# Patient Record
Sex: Female | Born: 1960
Health system: Southern US, Community
[De-identification: ages and names within clinical notes are randomized; demographics above are authoritative.]

## PROBLEM LIST (undated history)

## (undated) DIAGNOSIS — J309 Allergic rhinitis, unspecified: Secondary | ICD-10-CM

## (undated) DIAGNOSIS — I10 Essential (primary) hypertension: Secondary | ICD-10-CM

## (undated) DIAGNOSIS — Z8042 Family history of malignant neoplasm of prostate: Secondary | ICD-10-CM

## (undated) DIAGNOSIS — Z91012 Allergy to eggs: Secondary | ICD-10-CM

## (undated) DIAGNOSIS — Z801 Family history of malignant neoplasm of trachea, bronchus and lung: Secondary | ICD-10-CM

## (undated) DIAGNOSIS — G43909 Migraine, unspecified, not intractable, without status migrainosus: Secondary | ICD-10-CM

## (undated) DIAGNOSIS — M419 Scoliosis, unspecified: Secondary | ICD-10-CM

## (undated) HISTORY — DX: Migraine, unspecified, not intractable, without status migrainosus: G43.909

## (undated) HISTORY — DX: Allergy to eggs: Z91.012

## (undated) HISTORY — DX: Allergic rhinitis, unspecified: J30.9

## (undated) HISTORY — DX: Family history of malignant neoplasm of prostate: Z80.42

## (undated) HISTORY — PX: APPENDECTOMY: SHX54

## (undated) HISTORY — DX: Scoliosis, unspecified: M41.9

## (undated) HISTORY — DX: Essential (primary) hypertension: I10

## (undated) HISTORY — PX: OTHER SURGICAL HISTORY: SHX169

## (undated) HISTORY — DX: Family history of malignant neoplasm of trachea, bronchus and lung: Z80.1

---

## 2004-03-18 ENCOUNTER — Ambulatory Visit (HOSPITAL_BASED_OUTPATIENT_CLINIC_OR_DEPARTMENT_OTHER): Admission: RE | Admit: 2004-03-18 | Discharge: 2004-03-18 | Payer: Self-pay | Admitting: Orthopedic Surgery

## 2005-08-05 ENCOUNTER — Encounter: Admission: RE | Admit: 2005-08-05 | Discharge: 2005-08-05 | Payer: Self-pay | Admitting: Internal Medicine

## 2008-10-06 ENCOUNTER — Ambulatory Visit: Payer: Self-pay | Admitting: Internal Medicine

## 2009-02-25 ENCOUNTER — Emergency Department (HOSPITAL_COMMUNITY): Admission: EM | Admit: 2009-02-25 | Discharge: 2009-02-25 | Payer: Self-pay | Admitting: Family Medicine

## 2009-03-16 ENCOUNTER — Ambulatory Visit: Payer: Self-pay | Admitting: Internal Medicine

## 2009-06-19 ENCOUNTER — Ambulatory Visit: Payer: Self-pay | Admitting: Internal Medicine

## 2010-06-21 ENCOUNTER — Ambulatory Visit
Admission: RE | Admit: 2010-06-21 | Discharge: 2010-06-21 | Payer: Self-pay | Source: Home / Self Care | Attending: Internal Medicine | Admitting: Internal Medicine

## 2010-08-12 ENCOUNTER — Other Ambulatory Visit: Payer: Self-pay | Admitting: Internal Medicine

## 2010-08-13 ENCOUNTER — Encounter: Payer: Self-pay | Admitting: Internal Medicine

## 2010-08-13 DIAGNOSIS — Z0000 Encounter for general adult medical examination without abnormal findings: Secondary | ICD-10-CM

## 2010-09-13 ENCOUNTER — Other Ambulatory Visit: Payer: BC Managed Care – PPO | Admitting: Internal Medicine

## 2010-09-13 ENCOUNTER — Ambulatory Visit (INDEPENDENT_AMBULATORY_CARE_PROVIDER_SITE_OTHER): Payer: BC Managed Care – PPO | Admitting: Internal Medicine

## 2010-09-13 DIAGNOSIS — J309 Allergic rhinitis, unspecified: Secondary | ICD-10-CM

## 2010-09-16 ENCOUNTER — Ambulatory Visit (INDEPENDENT_AMBULATORY_CARE_PROVIDER_SITE_OTHER): Payer: BC Managed Care – PPO | Admitting: Internal Medicine

## 2010-09-16 DIAGNOSIS — J309 Allergic rhinitis, unspecified: Secondary | ICD-10-CM

## 2010-09-21 ENCOUNTER — Ambulatory Visit (INDEPENDENT_AMBULATORY_CARE_PROVIDER_SITE_OTHER): Payer: BC Managed Care – PPO | Admitting: Internal Medicine

## 2010-09-21 DIAGNOSIS — J301 Allergic rhinitis due to pollen: Secondary | ICD-10-CM

## 2010-09-24 ENCOUNTER — Ambulatory Visit (INDEPENDENT_AMBULATORY_CARE_PROVIDER_SITE_OTHER): Payer: BC Managed Care – PPO | Admitting: Internal Medicine

## 2010-09-24 DIAGNOSIS — J309 Allergic rhinitis, unspecified: Secondary | ICD-10-CM

## 2010-10-05 ENCOUNTER — Ambulatory Visit (INDEPENDENT_AMBULATORY_CARE_PROVIDER_SITE_OTHER): Payer: BC Managed Care – PPO

## 2010-10-05 ENCOUNTER — Ambulatory Visit: Payer: BC Managed Care – PPO | Admitting: Internal Medicine

## 2010-10-05 DIAGNOSIS — J309 Allergic rhinitis, unspecified: Secondary | ICD-10-CM

## 2010-10-08 ENCOUNTER — Ambulatory Visit (INDEPENDENT_AMBULATORY_CARE_PROVIDER_SITE_OTHER): Payer: BC Managed Care – PPO

## 2010-10-08 DIAGNOSIS — J309 Allergic rhinitis, unspecified: Secondary | ICD-10-CM

## 2010-10-08 NOTE — Progress Notes (Signed)
Patient here for allergy injections. Instructed patient to remain in office for 20 minutes after injections, and she stayed only 15 minutes.

## 2010-10-12 ENCOUNTER — Ambulatory Visit (INDEPENDENT_AMBULATORY_CARE_PROVIDER_SITE_OTHER): Payer: BC Managed Care – PPO | Admitting: *Deleted

## 2010-10-12 DIAGNOSIS — J309 Allergic rhinitis, unspecified: Secondary | ICD-10-CM

## 2010-10-15 NOTE — Op Note (Signed)
NAME:  TYERA, HANSLEY NO.:  [id number]   MEDICAL RECORD NO.:  [id number]          PATIENT TYPE:  AMB   LOCATION:  DSC                          FACILITY:  [facility]   PHYSICIAN:  Loreta Ave, M.D. 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  Status post previous arthroscopic  repair of Bankart cartilage and a thermocapsular shrinking. Failure with  recurrent dislocation following that.   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  Status post previous arthroscopic  repair of Bank cartilage and a thermocapsular shrinking. Failure with  recurrent dislocation following that.  Intact Bankart repair and capsular  stretch, relatively large Hill-Sachs lesion with chondral loose bodies.   PROCEDURE:  Right shoulder examination under anesthesia, arthroscopy,  removal of loose bodies, chondroplasty humerus, assessment of Bankart  repair.  Open anteroinferior capsular shift.   SURGEON:  Loreta Ave, M.D.   ASSISTANT:  Zonia Kief, P.A.-C.   ANESTHESIA:  General.   ESTIMATED BLOOD LOSS:  Minimal.   SPECIMENS:  None.   CULTURES:  None.   COMPLICATIONS:  None.   DRESSINGS:  Soft compressive with shoulder immobilizer.   TOURNIQUET TIME:  50 minutes.   DESCRIPTION OF PROCEDURE:  The patient was brought to the operating room and  after adequate anesthesia had been obtained,  right shoulder examined. Very  dramatic anterior instability with an anteroinferior component.  Somewhat of  a sulcus sign, a little posterior instability and global ligamentous  actually both sides, but the degree of anterior instability right shoulder  very dramatic.  Almost a posterior sulcus sign, even at rest. Placed in the  beach chair position, prepped and draped in the usual sterile fashion.   Anterior and posterior portals accessing the shoulder.  Shoulder inspected.  Bankart repair was still intact, a little bit of separation but really not a  significant recurrent lesion.  No further tearing labrum. A relatively  large, graphic 1.5 cm diameter Hill-Sachs lesion which would ride out the  front of the glenoid when she dislocated it about 90 degrees of external  rotation. Chondral loose bodies all removed. The margin of the Hill-Sachs  lesion debrided. Biceps tendon intact, cuff intact. After a thorough  inspection and debridement of the shoulder, instruments removed.   Anterior incision, coracoid to axillary fold, deltopectoral interval opened,  retractor put in place.  Conjoin tendon retracted.  Subscapularis tendon was  taken down, leaving some of the tendon on the capsule to reinforce the  capsular shift. Capsule was then cut from the 12 to 6 o'clock position on  the humerus, and at the 3 o'clock position, cut from the humerus to glenoid.  It was mobilized, freed up and although thickened from the thermal shrinkage  that it had in the past, this was still reasonable tissue quality.   I then did a shift, bringing the inferior leaflet up to 12 o'clock and  superior leaflet overlapping down to 6 o'clock.  Care was taken to avoid  injury to the axillary nerve at the bottom.  Thorough closure and  reinforcement of the capsular shift with numerous, nonabsorbable Ethibond  sutures. Subscapularis was then repaired anatomically.  This dramatically  improved the stability of the shoulder.  I could still bring her to a good  60 degrees of external rotation before I got tension feeling in the shift.  I felt like I had achieved what we needed to do to stabilize the shoulder.  The wound was irrigated, retractor removed.   Subcutaneous and subcuticular Vicryl closure over incision.  Nylon closure  of the posterior  portal.  Margins injected with Marcaine, as was the  shoulder.  Sterile, compressive dressing was applied, shoulder immobilizer  applied. Anesthesia was reversed, brought to the recovery room, tolerated  surgery well, no complications.      Ann Chambers   DFM/MEDQ  D:  03/18/2004  T:  03/18/2004  Job:  [id number]

## 2010-10-21 ENCOUNTER — Ambulatory Visit (INDEPENDENT_AMBULATORY_CARE_PROVIDER_SITE_OTHER): Payer: BC Managed Care – PPO | Admitting: *Deleted

## 2010-10-21 DIAGNOSIS — J309 Allergic rhinitis, unspecified: Secondary | ICD-10-CM

## 2010-10-21 NOTE — Progress Notes (Signed)
Pt here for allergy injections as ordered by [facility].  Administered per protocol.  Pt remained in office for 20 minutes with no reaction noted at injection site.  

## 2010-10-26 ENCOUNTER — Ambulatory Visit: Payer: BC Managed Care – PPO

## 2010-10-28 ENCOUNTER — Ambulatory Visit (INDEPENDENT_AMBULATORY_CARE_PROVIDER_SITE_OTHER): Payer: BC Managed Care – PPO | Admitting: Internal Medicine

## 2010-10-28 DIAGNOSIS — G43909 Migraine, unspecified, not intractable, without status migrainosus: Secondary | ICD-10-CM

## 2010-10-28 DIAGNOSIS — J309 Allergic rhinitis, unspecified: Secondary | ICD-10-CM

## 2010-10-28 NOTE — Progress Notes (Signed)
Patient here for allergy injections today per  Protocol from [facility]. Vial #1 T-G-W 1:100 dil 0.56ml in left arm. Dime sized redness after 20 minutes noted. Vial #2 D-M-DM 1:100 dil 0.3 ml [place] rt arm. No reaction after 20 minutes

## 2010-10-28 NOTE — Progress Notes (Signed)
  Subjective:    Patient ID: Ann Chambers, female    DOB: March 13, 1961, 50 y.o.   MRN: [id number]  HPI    Review of Systems     Objective:   Physical Exam        Assessment & Plan:

## 2010-11-02 ENCOUNTER — Ambulatory Visit (INDEPENDENT_AMBULATORY_CARE_PROVIDER_SITE_OTHER): Payer: BC Managed Care – PPO | Admitting: Internal Medicine

## 2010-11-02 ENCOUNTER — Encounter: Payer: Self-pay | Admitting: Internal Medicine

## 2010-11-02 DIAGNOSIS — J309 Allergic rhinitis, unspecified: Secondary | ICD-10-CM | POA: Insufficient documentation

## 2010-11-02 DIAGNOSIS — G43909 Migraine, unspecified, not intractable, without status migrainosus: Secondary | ICD-10-CM | POA: Insufficient documentation

## 2010-11-02 NOTE — Progress Notes (Signed)
  Subjective:    Patient ID: Ann Chambers, female    DOB: 01/06/1961, 50 y.o.   MRN: [id number]  HPI    Review of Systems     Objective:   Physical Exam        Assessment & Plan:   

## 2010-11-02 NOTE — Progress Notes (Signed)
  Subjective:    Patient ID: Ann Chambers, female    DOB: [birth year], 50 y.o.   MRN: [id number]  HPIFor allergy injections. Vaccine obtained from [facility]. It is easier for her to come here to get vaccines since she works nearby.Hx perennial and seasonal allergic rhinitis.    Review of Systems     Objective:   Physical ExamNot examined today jusy here for allergy injection.        Assessment & Plan:  Allergic rhiniits

## 2010-11-02 NOTE — Patient Instructions (Signed)
Return weekly for allergy injections

## 2010-11-04 ENCOUNTER — Ambulatory Visit: Payer: BC Managed Care – PPO

## 2010-11-04 ENCOUNTER — Ambulatory Visit: Payer: BC Managed Care – PPO | Admitting: Internal Medicine

## 2010-11-08 ENCOUNTER — Ambulatory Visit (INDEPENDENT_AMBULATORY_CARE_PROVIDER_SITE_OTHER): Payer: BC Managed Care – PPO | Admitting: Internal Medicine

## 2010-11-08 DIAGNOSIS — J309 Allergic rhinitis, unspecified: Secondary | ICD-10-CM

## 2010-11-08 NOTE — Progress Notes (Signed)
Patient here for allergy injections. Given per protocol from [facility].Tolerated well without evidence of reaction/ LReilandLpn

## 2010-11-11 ENCOUNTER — Ambulatory Visit (INDEPENDENT_AMBULATORY_CARE_PROVIDER_SITE_OTHER): Payer: BC Managed Care – PPO | Admitting: Internal Medicine

## 2010-11-11 DIAGNOSIS — J309 Allergic rhinitis, unspecified: Secondary | ICD-10-CM

## 2010-11-11 NOTE — Progress Notes (Signed)
Pt here for allergy injections as ordered by [facility].  Administered per protocol.  Pt remained in office for 20 minutes with no reaction noted at injection site.  

## 2010-11-15 ENCOUNTER — Ambulatory Visit (INDEPENDENT_AMBULATORY_CARE_PROVIDER_SITE_OTHER): Payer: BC Managed Care – PPO | Admitting: Internal Medicine

## 2010-11-15 DIAGNOSIS — J309 Allergic rhinitis, unspecified: Secondary | ICD-10-CM

## 2010-11-15 NOTE — Progress Notes (Signed)
After obtaining consent, and per orders of Dr. Lenord Fellers, injection of allergy given by Lonzo Candy. Patient instructed to remain in clinic for 20 minutes afterwards, and to report any adverse reaction to me immediately.

## 2010-11-22 ENCOUNTER — Ambulatory Visit (INDEPENDENT_AMBULATORY_CARE_PROVIDER_SITE_OTHER): Payer: BC Managed Care – PPO | Admitting: Internal Medicine

## 2010-11-22 DIAGNOSIS — J309 Allergic rhinitis, unspecified: Secondary | ICD-10-CM

## 2010-11-22 NOTE — Progress Notes (Signed)
Pt here for allergy injections as ordered by [facility].  Administered per protocol.  Pt remained in office for 20 minutes with no reaction noted at injection site.  

## 2010-12-02 ENCOUNTER — Ambulatory Visit (INDEPENDENT_AMBULATORY_CARE_PROVIDER_SITE_OTHER): Payer: BC Managed Care – PPO | Admitting: Internal Medicine

## 2010-12-02 DIAGNOSIS — J309 Allergic rhinitis, unspecified: Secondary | ICD-10-CM

## 2010-12-02 NOTE — Progress Notes (Signed)
Pt here for allergy injections as ordered by [facility].  Administered per protocol.  Pt remained in office for 20 minutes with no reaction noted at injection site.

## 2010-12-09 ENCOUNTER — Ambulatory Visit (INDEPENDENT_AMBULATORY_CARE_PROVIDER_SITE_OTHER): Payer: BC Managed Care – PPO | Admitting: Internal Medicine

## 2010-12-09 DIAGNOSIS — J309 Allergic rhinitis, unspecified: Secondary | ICD-10-CM

## 2010-12-09 NOTE — Progress Notes (Signed)
Pt here for allergy injections as ordered by [facility].  Administered per protocol.  Pt remained in office for 20 minutes with no reaction noted at injection site.  

## 2010-12-14 ENCOUNTER — Ambulatory Visit (INDEPENDENT_AMBULATORY_CARE_PROVIDER_SITE_OTHER): Payer: BC Managed Care – PPO | Admitting: Internal Medicine

## 2010-12-14 DIAGNOSIS — J309 Allergic rhinitis, unspecified: Secondary | ICD-10-CM

## 2010-12-14 NOTE — Progress Notes (Signed)
Pt here for allergy injections as ordered by [facility].  Administered per protocol.  Pt remained in office for 20 minutes with no reaction noted at injection site.  

## 2010-12-23 ENCOUNTER — Ambulatory Visit (INDEPENDENT_AMBULATORY_CARE_PROVIDER_SITE_OTHER): Payer: BC Managed Care – PPO | Admitting: Internal Medicine

## 2010-12-23 DIAGNOSIS — J309 Allergic rhinitis, unspecified: Secondary | ICD-10-CM

## 2010-12-23 DIAGNOSIS — J301 Allergic rhinitis due to pollen: Secondary | ICD-10-CM

## 2010-12-24 ENCOUNTER — Other Ambulatory Visit: Payer: Self-pay

## 2010-12-24 MED ORDER — ELETRIPTAN HYDROBROMIDE 40 MG PO TABS: 40.0000 mg | ORAL_TABLET | ORAL | Status: DC | PRN

## 2010-12-28 ENCOUNTER — Ambulatory Visit (INDEPENDENT_AMBULATORY_CARE_PROVIDER_SITE_OTHER): Payer: BC Managed Care – PPO | Admitting: Internal Medicine

## 2010-12-28 DIAGNOSIS — J309 Allergic rhinitis, unspecified: Secondary | ICD-10-CM

## 2010-12-28 NOTE — Progress Notes (Signed)
Pt here for allergy injections as ordered by [facility].  Administered per protocol.  Pt remained in office for 20 minutes with no reaction noted at injection site.  

## 2011-01-04 ENCOUNTER — Ambulatory Visit (INDEPENDENT_AMBULATORY_CARE_PROVIDER_SITE_OTHER): Payer: BC Managed Care – PPO | Admitting: Internal Medicine

## 2011-01-04 DIAGNOSIS — J309 Allergic rhinitis, unspecified: Secondary | ICD-10-CM

## 2011-01-04 NOTE — Progress Notes (Signed)
Pt here for allergy injections as ordered by [facility].  Administered per protocol.  Pt remained in office for 20 minutes with no reaction noted at injection site.  

## 2011-01-11 ENCOUNTER — Ambulatory Visit (INDEPENDENT_AMBULATORY_CARE_PROVIDER_SITE_OTHER): Payer: BC Managed Care – PPO | Admitting: Internal Medicine

## 2011-01-11 DIAGNOSIS — J309 Allergic rhinitis, unspecified: Secondary | ICD-10-CM

## 2011-01-11 NOTE — Progress Notes (Signed)
Pt here for allergy injections as ordered by [facility].  Administered per protocol.  Pt remained in office for 20 minutes with no reaction noted at injection site.  

## 2011-01-17 ENCOUNTER — Ambulatory Visit (INDEPENDENT_AMBULATORY_CARE_PROVIDER_SITE_OTHER): Payer: BC Managed Care – PPO | Admitting: Internal Medicine

## 2011-01-17 ENCOUNTER — Telehealth: Payer: Self-pay | Admitting: *Deleted

## 2011-01-17 DIAGNOSIS — J309 Allergic rhinitis, unspecified: Secondary | ICD-10-CM

## 2011-01-17 NOTE — Progress Notes (Signed)
Pt here for allergy injections as ordered by [facility].  Administered per protocol.  Pt remained in office for 20 minutes with no reaction noted at injection site.  

## 2011-01-17 NOTE — Telephone Encounter (Signed)
Spoke to Dr. Zelphia Cairo nurse regarding patient's allergy injections.  Instructed to continue with allergy injections once a week, stating that she can be at 5 ml for each vial.  She is scheduled for a follow-up appointment in March.

## 2011-01-25 ENCOUNTER — Ambulatory Visit (INDEPENDENT_AMBULATORY_CARE_PROVIDER_SITE_OTHER): Payer: BC Managed Care – PPO | Admitting: Internal Medicine

## 2011-01-25 DIAGNOSIS — J309 Allergic rhinitis, unspecified: Secondary | ICD-10-CM

## 2011-01-25 NOTE — Progress Notes (Signed)
Pt here for allergy injections as ordered by [facility].  Administered per protocol.  Pt remained in office for 20 minutes with no reaction noted at injection site.  

## 2011-01-28 ENCOUNTER — Ambulatory Visit: Payer: BC Managed Care – PPO | Admitting: Internal Medicine

## 2011-02-01 ENCOUNTER — Ambulatory Visit (INDEPENDENT_AMBULATORY_CARE_PROVIDER_SITE_OTHER): Payer: BC Managed Care – PPO | Admitting: Internal Medicine

## 2011-02-01 DIAGNOSIS — J309 Allergic rhinitis, unspecified: Secondary | ICD-10-CM

## 2011-02-01 NOTE — Progress Notes (Signed)
Pt here for allergy injections as ordered by [facility].  Administered per protocol.  Pt remained in office for 20 minutes with no reaction noted at injection site.

## 2011-02-07 ENCOUNTER — Ambulatory Visit: Payer: BC Managed Care – PPO | Admitting: Internal Medicine

## 2011-02-07 ENCOUNTER — Ambulatory Visit (INDEPENDENT_AMBULATORY_CARE_PROVIDER_SITE_OTHER): Payer: BC Managed Care – PPO | Admitting: Internal Medicine

## 2011-02-07 DIAGNOSIS — J309 Allergic rhinitis, unspecified: Secondary | ICD-10-CM

## 2011-02-07 NOTE — Progress Notes (Signed)
Pt here for allergy injections as ordered by [facility].  Administered per protocol.  Pt remained in office for 20 minutes with no reaction noted at injection site.  

## 2011-02-14 ENCOUNTER — Ambulatory Visit (INDEPENDENT_AMBULATORY_CARE_PROVIDER_SITE_OTHER): Payer: BC Managed Care – PPO | Admitting: Internal Medicine

## 2011-02-14 ENCOUNTER — Encounter: Payer: Self-pay | Admitting: Internal Medicine

## 2011-02-14 ENCOUNTER — Ambulatory Visit: Payer: BC Managed Care – PPO | Admitting: Internal Medicine

## 2011-02-14 VITALS — BP 110/62 | HR 68 | Temp 98.5°F | Ht 65.0 in | Wt 152.0 lb

## 2011-02-14 DIAGNOSIS — H6500 Acute serous otitis media, unspecified ear: Secondary | ICD-10-CM

## 2011-02-14 DIAGNOSIS — J069 Acute upper respiratory infection, unspecified: Secondary | ICD-10-CM

## 2011-02-14 DIAGNOSIS — J309 Allergic rhinitis, unspecified: Secondary | ICD-10-CM

## 2011-02-14 NOTE — Progress Notes (Signed)
  Subjective:    Patient ID: Ann Chambers, female    DOB: Jul 12, 1960, 50 y.o.   MRN: [id number]  HPI pleasant 50 year old white female with history of allergic rhinitis currently on twice weekly allergy injections as directed by Dr. Barnetta Chapel went to a wedding in [place] this past weekend. It ranged there the entire weekend. She has come down with respiratory infection. Has a lot of postnasal drip. No documented fever.    Review of Systems     Objective:   Physical Exam TMs are full bilaterally. Pharynx is clear. Neck is supple. Has anterior cervical nodes bilaterally. Chest is clear.        Assessment & Plan:  Serous otitis media  Upper respiratory infection  History of allergic rhinitis  Plan: Zithromax Z-Pak (6 tablets) 2 tablets by mouth day one followed by 1 tablet days 2 through 5 with 1 refill. If not better in one week have prescription refill. May take Allegra-D for congestion. May take Advil for headache or malaise. Hold off on allergy injections for one week.

## 2011-02-17 ENCOUNTER — Ambulatory Visit: Payer: BC Managed Care – PPO | Admitting: Internal Medicine

## 2011-03-04 ENCOUNTER — Ambulatory Visit (INDEPENDENT_AMBULATORY_CARE_PROVIDER_SITE_OTHER): Payer: BC Managed Care – PPO | Admitting: Internal Medicine

## 2011-03-04 DIAGNOSIS — J309 Allergic rhinitis, unspecified: Secondary | ICD-10-CM

## 2011-03-04 NOTE — Progress Notes (Signed)
  Subjective:    Patient ID: Ann Chambers, female    DOB: September 19, 1960, 50 y.o.   MRN: [id number]  HPI    Review of Systems     Objective:   Physical Exam        Assessment & Plan:

## 2011-03-07 ENCOUNTER — Ambulatory Visit (INDEPENDENT_AMBULATORY_CARE_PROVIDER_SITE_OTHER): Payer: BC Managed Care – PPO | Admitting: Internal Medicine

## 2011-03-07 DIAGNOSIS — J309 Allergic rhinitis, unspecified: Secondary | ICD-10-CM

## 2011-03-07 NOTE — Progress Notes (Signed)
Pt here for allergy injections as ordered by [facility].  Administered per protocol.  Pt remained in office for 20 minutes with no reaction noted at injection site.  

## 2011-03-15 ENCOUNTER — Ambulatory Visit (INDEPENDENT_AMBULATORY_CARE_PROVIDER_SITE_OTHER): Payer: BC Managed Care – PPO | Admitting: Internal Medicine

## 2011-03-15 DIAGNOSIS — J309 Allergic rhinitis, unspecified: Secondary | ICD-10-CM

## 2011-03-15 NOTE — Progress Notes (Signed)
Pt here for allergy injections as ordered by [facility].  Administered per protocol.  Pt remained in office for 20 minutes with no reaction noted at injection site.  

## 2011-03-21 ENCOUNTER — Ambulatory Visit (INDEPENDENT_AMBULATORY_CARE_PROVIDER_SITE_OTHER): Payer: BC Managed Care – PPO | Admitting: Internal Medicine

## 2011-03-21 DIAGNOSIS — J309 Allergic rhinitis, unspecified: Secondary | ICD-10-CM

## 2011-03-28 ENCOUNTER — Ambulatory Visit (INDEPENDENT_AMBULATORY_CARE_PROVIDER_SITE_OTHER): Payer: BC Managed Care – PPO | Admitting: Internal Medicine

## 2011-03-28 DIAGNOSIS — J309 Allergic rhinitis, unspecified: Secondary | ICD-10-CM

## 2011-03-28 DIAGNOSIS — J301 Allergic rhinitis due to pollen: Secondary | ICD-10-CM

## 2011-03-29 ENCOUNTER — Ambulatory Visit: Payer: BC Managed Care – PPO | Admitting: Internal Medicine

## 2011-04-07 ENCOUNTER — Ambulatory Visit: Payer: BC Managed Care – PPO | Admitting: Internal Medicine

## 2011-04-08 ENCOUNTER — Ambulatory Visit (INDEPENDENT_AMBULATORY_CARE_PROVIDER_SITE_OTHER): Payer: BC Managed Care – PPO | Admitting: Internal Medicine

## 2011-04-08 DIAGNOSIS — J301 Allergic rhinitis due to pollen: Secondary | ICD-10-CM

## 2011-04-08 DIAGNOSIS — J309 Allergic rhinitis, unspecified: Secondary | ICD-10-CM

## 2011-04-14 ENCOUNTER — Ambulatory Visit (INDEPENDENT_AMBULATORY_CARE_PROVIDER_SITE_OTHER): Payer: BC Managed Care – PPO | Admitting: Internal Medicine

## 2011-04-14 DIAGNOSIS — J309 Allergic rhinitis, unspecified: Secondary | ICD-10-CM

## 2011-04-14 NOTE — Progress Notes (Signed)
After obtaining consent, and per orders of Dr. Lenord Fellers, allergy injections given by Lonzo Candy. Patient instructed to remain in clinic for 20 minutes afterwards, and to report any adverse reaction to me immediately. No reaction noted.

## 2011-04-18 ENCOUNTER — Ambulatory Visit (INDEPENDENT_AMBULATORY_CARE_PROVIDER_SITE_OTHER): Payer: BC Managed Care – PPO | Admitting: Internal Medicine

## 2011-04-18 DIAGNOSIS — J309 Allergic rhinitis, unspecified: Secondary | ICD-10-CM

## 2011-04-29 ENCOUNTER — Ambulatory Visit (INDEPENDENT_AMBULATORY_CARE_PROVIDER_SITE_OTHER): Payer: BC Managed Care – PPO | Admitting: Internal Medicine

## 2011-04-29 DIAGNOSIS — J309 Allergic rhinitis, unspecified: Secondary | ICD-10-CM

## 2011-05-05 ENCOUNTER — Ambulatory Visit (INDEPENDENT_AMBULATORY_CARE_PROVIDER_SITE_OTHER): Payer: BC Managed Care – PPO | Admitting: Internal Medicine

## 2011-05-05 DIAGNOSIS — J309 Allergic rhinitis, unspecified: Secondary | ICD-10-CM

## 2011-05-10 ENCOUNTER — Ambulatory Visit (INDEPENDENT_AMBULATORY_CARE_PROVIDER_SITE_OTHER): Payer: BC Managed Care – PPO | Admitting: Internal Medicine

## 2011-05-10 DIAGNOSIS — J309 Allergic rhinitis, unspecified: Secondary | ICD-10-CM

## 2011-05-19 ENCOUNTER — Ambulatory Visit (INDEPENDENT_AMBULATORY_CARE_PROVIDER_SITE_OTHER): Payer: BC Managed Care – PPO | Admitting: Internal Medicine

## 2011-05-19 DIAGNOSIS — J309 Allergic rhinitis, unspecified: Secondary | ICD-10-CM

## 2011-05-27 ENCOUNTER — Ambulatory Visit: Payer: BC Managed Care – PPO | Admitting: Internal Medicine

## 2011-05-28 NOTE — Patient Instructions (Signed)
Call if reaction to allergy injection develops. Otherwise return for routine allergy injection at scheduled time

## 2011-05-28 NOTE — Progress Notes (Signed)
  Subjective:    Patient ID: Ann Chambers, female    DOB: 1961/02/06, 50 y.o.   MRN: [id number]  HPI in today for nurse visit for routine allergy injection. No complaints or problems.    Review of Systems     Objective:   Physical Exam not seen by a physician.        Assessment & Plan:  Allergic rhinitis  Plan allergy injection given her plan

## 2011-06-02 ENCOUNTER — Ambulatory Visit (INDEPENDENT_AMBULATORY_CARE_PROVIDER_SITE_OTHER): Payer: BC Managed Care – PPO | Admitting: Internal Medicine

## 2011-06-02 DIAGNOSIS — J309 Allergic rhinitis, unspecified: Secondary | ICD-10-CM

## 2011-06-09 ENCOUNTER — Ambulatory Visit (INDEPENDENT_AMBULATORY_CARE_PROVIDER_SITE_OTHER): Payer: BC Managed Care – PPO | Admitting: Internal Medicine

## 2011-06-09 DIAGNOSIS — J301 Allergic rhinitis due to pollen: Secondary | ICD-10-CM

## 2011-06-09 DIAGNOSIS — J309 Allergic rhinitis, unspecified: Secondary | ICD-10-CM

## 2011-06-16 ENCOUNTER — Ambulatory Visit: Payer: BC Managed Care – PPO | Admitting: Internal Medicine

## 2011-06-20 ENCOUNTER — Ambulatory Visit (INDEPENDENT_AMBULATORY_CARE_PROVIDER_SITE_OTHER): Payer: BC Managed Care – PPO | Admitting: Internal Medicine

## 2011-06-20 ENCOUNTER — Encounter: Payer: Self-pay | Admitting: Internal Medicine

## 2011-06-20 DIAGNOSIS — J309 Allergic rhinitis, unspecified: Secondary | ICD-10-CM

## 2011-06-20 NOTE — Patient Instructions (Signed)
Continue allergy injections on regular basis twice weekly

## 2011-06-20 NOTE — Progress Notes (Signed)
  Subjective:    Patient ID: Ann Chambers, female    DOB: 03-Jun-1960, 51 y.o.   MRN: [id number]  HPI for allergy injection today. Has not been having as many bouts of sinusitis this winter says she's been taking allergy injections on a regular basis. No significant reactions to allergy injections. Tries to come twice weekly to get these.    Review of Systems     Objective:   Physical Exam not examined        Assessment & Plan:  Allergic rhinitis

## 2011-06-26 ENCOUNTER — Encounter: Payer: Self-pay | Admitting: Internal Medicine

## 2011-06-28 ENCOUNTER — Ambulatory Visit (INDEPENDENT_AMBULATORY_CARE_PROVIDER_SITE_OTHER): Payer: BC Managed Care – PPO | Admitting: Internal Medicine

## 2011-06-28 DIAGNOSIS — J309 Allergic rhinitis, unspecified: Secondary | ICD-10-CM

## 2011-07-05 ENCOUNTER — Ambulatory Visit (INDEPENDENT_AMBULATORY_CARE_PROVIDER_SITE_OTHER): Payer: BC Managed Care – PPO | Admitting: Internal Medicine

## 2011-07-05 DIAGNOSIS — J309 Allergic rhinitis, unspecified: Secondary | ICD-10-CM

## 2011-07-05 DIAGNOSIS — J3089 Other allergic rhinitis: Secondary | ICD-10-CM

## 2011-07-14 ENCOUNTER — Ambulatory Visit (INDEPENDENT_AMBULATORY_CARE_PROVIDER_SITE_OTHER): Payer: BC Managed Care – PPO | Admitting: Internal Medicine

## 2011-07-14 DIAGNOSIS — J309 Allergic rhinitis, unspecified: Secondary | ICD-10-CM

## 2011-07-14 NOTE — Patient Instructions (Signed)
Return next week for allergy injection

## 2011-07-21 ENCOUNTER — Ambulatory Visit (INDEPENDENT_AMBULATORY_CARE_PROVIDER_SITE_OTHER): Payer: BC Managed Care – PPO | Admitting: Internal Medicine

## 2011-07-21 DIAGNOSIS — J309 Allergic rhinitis, unspecified: Secondary | ICD-10-CM

## 2011-07-25 ENCOUNTER — Ambulatory Visit (INDEPENDENT_AMBULATORY_CARE_PROVIDER_SITE_OTHER): Payer: BC Managed Care – PPO | Admitting: Internal Medicine

## 2011-07-25 DIAGNOSIS — J309 Allergic rhinitis, unspecified: Secondary | ICD-10-CM

## 2011-08-02 ENCOUNTER — Ambulatory Visit (INDEPENDENT_AMBULATORY_CARE_PROVIDER_SITE_OTHER): Payer: BC Managed Care – PPO | Admitting: Internal Medicine

## 2011-08-02 DIAGNOSIS — J309 Allergic rhinitis, unspecified: Secondary | ICD-10-CM

## 2011-08-08 ENCOUNTER — Ambulatory Visit (INDEPENDENT_AMBULATORY_CARE_PROVIDER_SITE_OTHER): Payer: BC Managed Care – PPO | Admitting: Internal Medicine

## 2011-08-08 DIAGNOSIS — J309 Allergic rhinitis, unspecified: Secondary | ICD-10-CM

## 2011-08-08 DIAGNOSIS — J3089 Other allergic rhinitis: Secondary | ICD-10-CM

## 2011-08-16 ENCOUNTER — Encounter: Payer: Self-pay | Admitting: Internal Medicine

## 2011-08-16 ENCOUNTER — Ambulatory Visit (INDEPENDENT_AMBULATORY_CARE_PROVIDER_SITE_OTHER): Payer: BC Managed Care – PPO | Admitting: Internal Medicine

## 2011-08-16 DIAGNOSIS — J309 Allergic rhinitis, unspecified: Secondary | ICD-10-CM

## 2011-08-25 ENCOUNTER — Ambulatory Visit (INDEPENDENT_AMBULATORY_CARE_PROVIDER_SITE_OTHER): Payer: BC Managed Care – PPO | Admitting: Internal Medicine

## 2011-08-25 DIAGNOSIS — J309 Allergic rhinitis, unspecified: Secondary | ICD-10-CM

## 2011-09-01 ENCOUNTER — Ambulatory Visit (INDEPENDENT_AMBULATORY_CARE_PROVIDER_SITE_OTHER): Payer: BC Managed Care – PPO | Admitting: Internal Medicine

## 2011-09-01 DIAGNOSIS — J309 Allergic rhinitis, unspecified: Secondary | ICD-10-CM

## 2011-09-05 ENCOUNTER — Telehealth: Payer: Self-pay | Admitting: Internal Medicine

## 2011-09-05 MED ORDER — AZITHROMYCIN 250 MG PO TABS: ORAL_TABLET | ORAL | Status: AC

## 2011-09-05 NOTE — Telephone Encounter (Signed)
Per Dr. Lenord Fellers, OK to call in a Zpak, 1 as directed to [address]

## 2011-09-16 ENCOUNTER — Ambulatory Visit: Payer: BC Managed Care – PPO | Admitting: Internal Medicine

## 2011-09-22 ENCOUNTER — Encounter: Payer: Self-pay | Admitting: Internal Medicine

## 2011-09-22 ENCOUNTER — Ambulatory Visit (INDEPENDENT_AMBULATORY_CARE_PROVIDER_SITE_OTHER): Payer: BC Managed Care – PPO | Admitting: Internal Medicine

## 2011-09-22 DIAGNOSIS — J309 Allergic rhinitis, unspecified: Secondary | ICD-10-CM

## 2011-09-22 NOTE — Progress Notes (Signed)
  Subjective:    Patient ID: Ann Chambers, female    DOB: Oct 25, 1960, 51 y.o.   MRN: [id number]  HPI for allergy injection given by nurse. Not seen by a physician.    Review of Systems     Objective:   Physical Exam not examined        Assessment & Plan:  Allergic rhinitis  Plan: Return next week for allergy injection.

## 2011-09-22 NOTE — Patient Instructions (Signed)
Return next week for allergy injection

## 2011-09-26 ENCOUNTER — Encounter: Payer: Self-pay | Admitting: Internal Medicine

## 2011-09-29 ENCOUNTER — Ambulatory Visit (INDEPENDENT_AMBULATORY_CARE_PROVIDER_SITE_OTHER): Payer: BC Managed Care – PPO | Admitting: Internal Medicine

## 2011-09-29 DIAGNOSIS — J309 Allergic rhinitis, unspecified: Secondary | ICD-10-CM

## 2011-10-06 ENCOUNTER — Ambulatory Visit (INDEPENDENT_AMBULATORY_CARE_PROVIDER_SITE_OTHER): Payer: BC Managed Care – PPO | Admitting: Internal Medicine

## 2011-10-06 DIAGNOSIS — J309 Allergic rhinitis, unspecified: Secondary | ICD-10-CM

## 2011-10-11 ENCOUNTER — Ambulatory Visit (INDEPENDENT_AMBULATORY_CARE_PROVIDER_SITE_OTHER): Payer: BC Managed Care – PPO | Admitting: Internal Medicine

## 2011-10-11 DIAGNOSIS — J309 Allergic rhinitis, unspecified: Secondary | ICD-10-CM

## 2011-10-18 ENCOUNTER — Ambulatory Visit (INDEPENDENT_AMBULATORY_CARE_PROVIDER_SITE_OTHER): Payer: BC Managed Care – PPO | Admitting: Internal Medicine

## 2011-10-18 DIAGNOSIS — J309 Allergic rhinitis, unspecified: Secondary | ICD-10-CM

## 2011-10-20 ENCOUNTER — Ambulatory Visit: Payer: BC Managed Care – PPO | Admitting: Internal Medicine

## 2011-10-25 ENCOUNTER — Ambulatory Visit (INDEPENDENT_AMBULATORY_CARE_PROVIDER_SITE_OTHER): Payer: BC Managed Care – PPO | Admitting: Internal Medicine

## 2011-10-25 ENCOUNTER — Ambulatory Visit: Payer: BC Managed Care – PPO | Admitting: Internal Medicine

## 2011-10-25 DIAGNOSIS — J309 Allergic rhinitis, unspecified: Secondary | ICD-10-CM

## 2011-10-31 ENCOUNTER — Ambulatory Visit (INDEPENDENT_AMBULATORY_CARE_PROVIDER_SITE_OTHER): Payer: BC Managed Care – PPO | Admitting: Internal Medicine

## 2011-10-31 DIAGNOSIS — J309 Allergic rhinitis, unspecified: Secondary | ICD-10-CM

## 2011-11-14 ENCOUNTER — Ambulatory Visit (INDEPENDENT_AMBULATORY_CARE_PROVIDER_SITE_OTHER): Payer: BC Managed Care – PPO | Admitting: Internal Medicine

## 2011-11-14 DIAGNOSIS — J309 Allergic rhinitis, unspecified: Secondary | ICD-10-CM

## 2011-11-29 ENCOUNTER — Ambulatory Visit (INDEPENDENT_AMBULATORY_CARE_PROVIDER_SITE_OTHER): Payer: BC Managed Care – PPO | Admitting: Internal Medicine

## 2011-11-29 DIAGNOSIS — J309 Allergic rhinitis, unspecified: Secondary | ICD-10-CM

## 2011-12-12 ENCOUNTER — Ambulatory Visit (INDEPENDENT_AMBULATORY_CARE_PROVIDER_SITE_OTHER): Payer: BC Managed Care – PPO | Admitting: Internal Medicine

## 2011-12-12 DIAGNOSIS — J309 Allergic rhinitis, unspecified: Secondary | ICD-10-CM

## 2011-12-13 ENCOUNTER — Telehealth: Payer: Self-pay | Admitting: Internal Medicine

## 2011-12-13 ENCOUNTER — Ambulatory Visit (INDEPENDENT_AMBULATORY_CARE_PROVIDER_SITE_OTHER): Payer: BC Managed Care – PPO | Admitting: Internal Medicine

## 2011-12-13 ENCOUNTER — Encounter: Payer: Self-pay | Admitting: Internal Medicine

## 2011-12-13 VITALS — BP 140/100 | HR 84 | Temp 98.5°F | Ht 65.0 in | Wt 152.0 lb

## 2011-12-13 DIAGNOSIS — L259 Unspecified contact dermatitis, unspecified cause: Secondary | ICD-10-CM

## 2011-12-13 NOTE — Telephone Encounter (Signed)
Sp w/pt; adv MJB would like for her to come in for OV.  She'll be here today @ 4pm to see if oral Prednisone is needed, etc.  Pt understands.

## 2011-12-13 NOTE — Telephone Encounter (Signed)
Needs OV today to get oral prednisone.

## 2011-12-26 NOTE — Progress Notes (Signed)
  Subjective:    Patient ID: Ann Chambers, female    DOB: [birth year], 51 y.o.   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 She's going out of town later in the week for a wedding. Has rash on right side of face and thinks it might be poison ivy. Just appeared this morning.    Review of Systems     Objective:   Physical Exam papular erythematous lesions right side of face consistent with contact dermatitis. No vesicles or drainage.        Assessment & Plan:  Contact dermatitis  Plan: Sterapred DS 10 mg 6 day dosepak take as directed. I note that her blood pressure is elevated today and we will continue to monitor blood pressures when she returns for routine immunotherapy injections.

## 2011-12-26 NOTE — Patient Instructions (Addendum)
Take Sterapred DS 10 mg 6 day dosepak as directed. 

## 2011-12-27 ENCOUNTER — Ambulatory Visit (INDEPENDENT_AMBULATORY_CARE_PROVIDER_SITE_OTHER): Payer: BC Managed Care – PPO | Admitting: Internal Medicine

## 2011-12-27 ENCOUNTER — Encounter: Payer: Self-pay | Admitting: Internal Medicine

## 2011-12-27 DIAGNOSIS — J309 Allergic rhinitis, unspecified: Secondary | ICD-10-CM

## 2012-01-10 ENCOUNTER — Encounter: Payer: Self-pay | Admitting: Internal Medicine

## 2012-01-10 ENCOUNTER — Ambulatory Visit (INDEPENDENT_AMBULATORY_CARE_PROVIDER_SITE_OTHER): Payer: BC Managed Care – PPO | Admitting: Internal Medicine

## 2012-01-10 DIAGNOSIS — J309 Allergic rhinitis, unspecified: Secondary | ICD-10-CM

## 2012-01-10 DIAGNOSIS — I10 Essential (primary) hypertension: Secondary | ICD-10-CM

## 2012-01-10 NOTE — Patient Instructions (Addendum)
Start Diovan 80 mg daily in the mornings and return in 2 weeks

## 2012-01-10 NOTE — Progress Notes (Signed)
  Subjective:    Patient ID: Ann Chambers, female    DOB: Dec 27, 1960, 51 y.o.   MRN: [id number]  HPI patient says sister has a condition called Essential Thrombocytosis and is on aspirin therapy. Sister also has hypertension. Patient's father had hypertension. Patient's blood pressure has been elevated recently. We've been following it for the past several weeks. Seems to be elevated during the day. She's been watching at home and at work. More likely to be normal at home than at work. Sister is on Diovan. Patient is off oral contraceptives now for the past 2 months. Despite that blood pressure is elevated. Review of Systems     Objective:   Physical Exam Chest clear to auscultation; cardiac exam regular rate and rhythm; extremities without edema, alert and oriented x3, skin is pale warm and dry        Assessment & Plan:  Hypertension  Allergic rhinitis  Plan: Start Diovan 80 mg daily and recheck blood pressure with office visit for allergy injection in 2 weeks. She has a physical exam scheduled in the near future and will be having fasting lab studies so we can check her platelet count. Her platelet count in 2011 was 270,000.

## 2012-01-12 ENCOUNTER — Other Ambulatory Visit: Payer: Self-pay

## 2012-01-12 MED ORDER — ELETRIPTAN HYDROBROMIDE 40 MG PO TABS: 40.0000 mg | ORAL_TABLET | ORAL | Status: DC | PRN

## 2012-01-24 ENCOUNTER — Ambulatory Visit (INDEPENDENT_AMBULATORY_CARE_PROVIDER_SITE_OTHER): Payer: BC Managed Care – PPO | Admitting: Internal Medicine

## 2012-01-24 DIAGNOSIS — I10 Essential (primary) hypertension: Secondary | ICD-10-CM

## 2012-01-24 DIAGNOSIS — J309 Allergic rhinitis, unspecified: Secondary | ICD-10-CM

## 2012-01-26 ENCOUNTER — Ambulatory Visit: Payer: BC Managed Care – PPO | Admitting: Internal Medicine

## 2012-01-27 ENCOUNTER — Encounter: Payer: Self-pay | Admitting: Internal Medicine

## 2012-01-27 NOTE — Progress Notes (Signed)
  Subjective:    Patient ID: Ann Chambers, female    DOB: 1961/03/21, 51 y.o.   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 This was selected because her sister is also on same medication. It has worked well for patient thus far without side effects. Blood pressure is markedly improved. Allergic rhinitis symptoms are improved on allergy injections. She recently obtained a new bottle of serum. Has tolerated the injections well without side effects or local reactions.    Review of Systems     Objective:   Physical Exam chest clear to auscultation, cardiac exam regular rate and rhythm normal S1 and S2, extremities without edema. No local reaction after allergy injection        Assessment & Plan:  Hypertension Allergic rhinitis  Plan: Continue allergy injections as previously scheduled. Blood pressure will be monitored at each allergy injection visit. Continue Diovan 80 mg daily. Time spent with patient 15 minutes.

## 2012-01-27 NOTE — Patient Instructions (Addendum)
Continue Diovan 80 mg daily for hypertension. We will continue giving allergy injections routinely. Blood pressure will be checked at time of allergy injections.

## 2012-02-07 ENCOUNTER — Ambulatory Visit (INDEPENDENT_AMBULATORY_CARE_PROVIDER_SITE_OTHER): Payer: BC Managed Care – PPO | Admitting: Internal Medicine

## 2012-02-07 DIAGNOSIS — J309 Allergic rhinitis, unspecified: Secondary | ICD-10-CM

## 2012-02-20 ENCOUNTER — Other Ambulatory Visit (INDEPENDENT_AMBULATORY_CARE_PROVIDER_SITE_OTHER): Payer: BC Managed Care – PPO | Admitting: Internal Medicine

## 2012-02-20 DIAGNOSIS — I10 Essential (primary) hypertension: Secondary | ICD-10-CM

## 2012-02-20 DIAGNOSIS — G43909 Migraine, unspecified, not intractable, without status migrainosus: Secondary | ICD-10-CM

## 2012-02-20 DIAGNOSIS — J309 Allergic rhinitis, unspecified: Secondary | ICD-10-CM

## 2012-02-21 ENCOUNTER — Encounter: Payer: Self-pay | Admitting: Internal Medicine

## 2012-02-21 ENCOUNTER — Ambulatory Visit (INDEPENDENT_AMBULATORY_CARE_PROVIDER_SITE_OTHER): Payer: BC Managed Care – PPO | Admitting: Internal Medicine

## 2012-02-21 VITALS — BP 114/66 | HR 84 | Temp 98.4°F | Ht 64.5 in | Wt 150.5 lb

## 2012-02-21 DIAGNOSIS — J309 Allergic rhinitis, unspecified: Secondary | ICD-10-CM

## 2012-02-21 DIAGNOSIS — I10 Essential (primary) hypertension: Secondary | ICD-10-CM

## 2012-02-21 LAB — CBC WITH DIFFERENTIAL/PLATELET
Basophils Absolute: 0 10*3/uL (ref 0.0–0.1)
Hemoglobin: 13 g/dL (ref 12.0–15.0)
MCH: 30.6 pg (ref 26.0–34.0)
MCV: 89.6 fL (ref 78.0–100.0)
Neutro Abs: 4.1 10*3/uL (ref 1.7–7.7)
Neutrophils Relative %: 65 % (ref 43–77)
Platelets: 288 10*3/uL (ref 150–400)
RBC: 4.25 MIL/uL (ref 3.87–5.11)
RDW: 13 % (ref 11.5–15.5)
WBC: 6.2 10*3/uL (ref 4.0–10.5)

## 2012-02-21 LAB — POCT URINALYSIS DIPSTICK
Bilirubin, UA: NEGATIVE
Blood, UA: NEGATIVE
Glucose, UA: NEGATIVE
Ketones, UA: NEGATIVE
Spec Grav, UA: 1.015
pH, UA: 6

## 2012-02-21 LAB — COMPREHENSIVE METABOLIC PANEL
AST: 26 U/L (ref 0–37)
BUN: 15 mg/dL (ref 6–23)
CO2: 26 mEq/L (ref 19–32)
Glucose, Bld: 85 mg/dL (ref 70–99)
Sodium: 139 mEq/L (ref 135–145)

## 2012-02-21 LAB — VITAMIN D 25 HYDROXY (VIT D DEFICIENCY, FRACTURES): Vit D, 25-Hydroxy: 41 ng/mL (ref 30–89)

## 2012-02-21 LAB — LIPID PANEL
LDL Cholesterol: 65 mg/dL (ref 0–99)
Total CHOL/HDL Ratio: 1.8 Ratio
VLDL: 10 mg/dL (ref 0–40)

## 2012-02-22 MED ORDER — VALSARTAN 80 MG PO TABS: 80.0000 mg | ORAL_TABLET | Freq: Every day | ORAL | Status: DC

## 2012-03-05 ENCOUNTER — Ambulatory Visit: Payer: BC Managed Care – PPO | Admitting: Internal Medicine

## 2012-03-08 ENCOUNTER — Ambulatory Visit (INDEPENDENT_AMBULATORY_CARE_PROVIDER_SITE_OTHER): Payer: BC Managed Care – PPO | Admitting: Internal Medicine

## 2012-03-08 DIAGNOSIS — J309 Allergic rhinitis, unspecified: Secondary | ICD-10-CM

## 2012-03-13 ENCOUNTER — Ambulatory Visit (INDEPENDENT_AMBULATORY_CARE_PROVIDER_SITE_OTHER): Payer: BC Managed Care – PPO | Admitting: Internal Medicine

## 2012-03-13 DIAGNOSIS — Z23 Encounter for immunization: Secondary | ICD-10-CM

## 2012-03-14 MED ORDER — TETANUS-DIPHTH-ACELL PERTUSSIS 5-2.5-18.5 LF-MCG/0.5 IM SUSP: 0.5000 mL | Freq: Once | INTRAMUSCULAR | Status: DC

## 2012-03-20 ENCOUNTER — Ambulatory Visit (INDEPENDENT_AMBULATORY_CARE_PROVIDER_SITE_OTHER): Payer: BC Managed Care – PPO | Admitting: Internal Medicine

## 2012-03-20 DIAGNOSIS — J309 Allergic rhinitis, unspecified: Secondary | ICD-10-CM

## 2012-03-29 ENCOUNTER — Ambulatory Visit (INDEPENDENT_AMBULATORY_CARE_PROVIDER_SITE_OTHER): Payer: BC Managed Care – PPO | Admitting: Internal Medicine

## 2012-03-29 DIAGNOSIS — J309 Allergic rhinitis, unspecified: Secondary | ICD-10-CM

## 2012-03-31 NOTE — Progress Notes (Signed)
  Subjective:    Patient ID: Ann Chambers, female    DOB: [birth year], 51 y.o.   MRN: [id number]  HPI 51 year old white female with history of allergic rhinitis and hypertension as well as migraine headaches in today for health maintenance exam and evaluation of medical problems. Patient has GYN physician. Is menopausal. Received allergy injections through this office but serum is prepared by [facility]. She has a history of lactose intolerance and irritable bowel syndrome. Skin testing in 2011 showed reactions to grass pollens, weeds and a large reaction to dust mites. Intradermal testing revealed positive reactions to tree pollens house dust and molds spores  No known drug allergies  Had colonoscopy 07/29/1996  Had appendectomy  at [facility] in 1996  Right shoulder surgery by Dr. Eulah Pont October 2005 another right shoulder surgery by Dr. Eulah Pont in 2002  Seen in the emergency department with acute abdominal pain December 1997. CT showed thickening of midportion of the ascending colon. Subsequent colonoscopy was negative.  Gets annual mammogram.    Review of Systems  Constitutional: Negative.   HENT: Positive for rhinorrhea.   Eyes: Negative.   Respiratory: Negative.   Cardiovascular: Negative.   Gastrointestinal: Negative.   Genitourinary: Negative.   Musculoskeletal: Negative.   Neurological: Positive for headaches.  Hematological: Negative.   Psychiatric/Behavioral: Negative.        Objective:   Physical Exam  Vitals reviewed. Constitutional: She is oriented to person, place, and time. She appears well-developed and well-nourished. No distress.  HENT:  Head: Normocephalic and atraumatic.  Right Ear: External ear normal.  Left Ear: External ear normal.  Mouth/Throat: Oropharynx is clear and moist.  Eyes: Conjunctivae normal and EOM are normal. Pupils are equal, round, and reactive to light. Right eye exhibits no discharge. Left eye exhibits no discharge. No  scleral icterus.  Neck: Neck supple. No JVD present. No thyromegaly present.  Cardiovascular: Normal rate, regular rhythm, normal heart sounds and intact distal pulses.   No murmur heard. Pulmonary/Chest: Effort normal and breath sounds normal. She has no wheezes. She has no rales.  Abdominal: Soft. Bowel sounds are normal. She exhibits no distension and no mass. There is no tenderness. There is no rebound and no guarding.  Genitourinary:       Deferred to GYN  Musculoskeletal: Normal range of motion. She exhibits no edema.  Lymphadenopathy:    She has no cervical adenopathy.  Neurological: She is alert and oriented to person, place, and time. She has normal reflexes. No cranial nerve deficit. Coordination normal.  Skin: Skin is warm and dry. No rash noted. She is not diaphoretic.  Psychiatric: She has a normal mood and affect. Her behavior is normal. Judgment and thought content normal.          Assessment & Plan:  Allergic rhinitis-stable on immunotherapy  Hypertension under good control on current regimen  History of migraine headaches  History of lactose intolerance  History of irritable bowel syndrome  Plan: Continue allergy immunotherapy as recommended by allergist. Continue antihypertensive medication. Return in 6 months for office visit and blood pressure evaluation. Continue allergy immunotherapy injections here through this office under direction of allergist.

## 2012-04-12 ENCOUNTER — Ambulatory Visit (INDEPENDENT_AMBULATORY_CARE_PROVIDER_SITE_OTHER): Payer: BC Managed Care – PPO | Admitting: Internal Medicine

## 2012-04-12 DIAGNOSIS — J309 Allergic rhinitis, unspecified: Secondary | ICD-10-CM

## 2012-04-23 ENCOUNTER — Ambulatory Visit (INDEPENDENT_AMBULATORY_CARE_PROVIDER_SITE_OTHER): Payer: BC Managed Care – PPO | Admitting: Internal Medicine

## 2012-04-23 DIAGNOSIS — J309 Allergic rhinitis, unspecified: Secondary | ICD-10-CM

## 2012-04-28 ENCOUNTER — Encounter: Payer: Self-pay | Admitting: Internal Medicine

## 2012-04-28 NOTE — Patient Instructions (Addendum)
Continue allergy injections per allergist. Continue antihypertensive medication and return in 6 months for office visit and blood pressure check.

## 2012-05-07 ENCOUNTER — Ambulatory Visit (INDEPENDENT_AMBULATORY_CARE_PROVIDER_SITE_OTHER): Payer: BC Managed Care – PPO | Admitting: Internal Medicine

## 2012-05-07 DIAGNOSIS — J309 Allergic rhinitis, unspecified: Secondary | ICD-10-CM

## 2012-05-21 ENCOUNTER — Ambulatory Visit (INDEPENDENT_AMBULATORY_CARE_PROVIDER_SITE_OTHER): Payer: BC Managed Care – PPO | Admitting: Internal Medicine

## 2012-05-21 DIAGNOSIS — J309 Allergic rhinitis, unspecified: Secondary | ICD-10-CM

## 2012-06-05 ENCOUNTER — Ambulatory Visit: Payer: BC Managed Care – PPO | Admitting: Internal Medicine

## 2012-06-15 ENCOUNTER — Ambulatory Visit (INDEPENDENT_AMBULATORY_CARE_PROVIDER_SITE_OTHER): Payer: BC Managed Care – PPO | Admitting: Internal Medicine

## 2012-06-15 DIAGNOSIS — J309 Allergic rhinitis, unspecified: Secondary | ICD-10-CM

## 2012-06-26 ENCOUNTER — Ambulatory Visit: Payer: BC Managed Care – PPO | Admitting: Internal Medicine

## 2012-06-28 ENCOUNTER — Ambulatory Visit (INDEPENDENT_AMBULATORY_CARE_PROVIDER_SITE_OTHER): Payer: BC Managed Care – PPO | Admitting: Internal Medicine

## 2012-06-28 DIAGNOSIS — J309 Allergic rhinitis, unspecified: Secondary | ICD-10-CM

## 2012-07-17 ENCOUNTER — Ambulatory Visit (INDEPENDENT_AMBULATORY_CARE_PROVIDER_SITE_OTHER): Payer: BC Managed Care – PPO | Admitting: Internal Medicine

## 2012-07-17 DIAGNOSIS — J309 Allergic rhinitis, unspecified: Secondary | ICD-10-CM

## 2012-07-30 ENCOUNTER — Ambulatory Visit (INDEPENDENT_AMBULATORY_CARE_PROVIDER_SITE_OTHER): Payer: BC Managed Care – PPO | Admitting: Internal Medicine

## 2012-08-09 ENCOUNTER — Other Ambulatory Visit: Payer: Self-pay

## 2012-08-09 MED ORDER — VALSARTAN 80 MG PO TABS: 80.0000 mg | ORAL_TABLET | Freq: Every day | ORAL | Status: DC

## 2012-08-13 ENCOUNTER — Ambulatory Visit: Payer: BC Managed Care – PPO | Admitting: Internal Medicine

## 2012-08-13 DIAGNOSIS — J309 Allergic rhinitis, unspecified: Secondary | ICD-10-CM

## 2012-09-04 ENCOUNTER — Ambulatory Visit (INDEPENDENT_AMBULATORY_CARE_PROVIDER_SITE_OTHER): Payer: BC Managed Care – PPO | Admitting: Internal Medicine

## 2012-09-04 DIAGNOSIS — J309 Allergic rhinitis, unspecified: Secondary | ICD-10-CM

## 2012-09-27 ENCOUNTER — Other Ambulatory Visit (INDEPENDENT_AMBULATORY_CARE_PROVIDER_SITE_OTHER): Payer: BC Managed Care – PPO | Admitting: Internal Medicine

## 2012-09-27 DIAGNOSIS — J309 Allergic rhinitis, unspecified: Secondary | ICD-10-CM

## 2012-10-18 ENCOUNTER — Ambulatory Visit (INDEPENDENT_AMBULATORY_CARE_PROVIDER_SITE_OTHER): Payer: BC Managed Care – PPO | Admitting: Internal Medicine

## 2012-10-18 DIAGNOSIS — J309 Allergic rhinitis, unspecified: Secondary | ICD-10-CM

## 2012-11-09 ENCOUNTER — Ambulatory Visit: Payer: BC Managed Care – PPO | Admitting: Internal Medicine

## 2012-11-13 ENCOUNTER — Ambulatory Visit (INDEPENDENT_AMBULATORY_CARE_PROVIDER_SITE_OTHER): Payer: Managed Care, Other (non HMO) | Admitting: Internal Medicine

## 2012-11-13 DIAGNOSIS — J309 Allergic rhinitis, unspecified: Secondary | ICD-10-CM

## 2012-11-27 ENCOUNTER — Ambulatory Visit (INDEPENDENT_AMBULATORY_CARE_PROVIDER_SITE_OTHER): Payer: BC Managed Care – PPO | Admitting: Internal Medicine

## 2012-11-27 DIAGNOSIS — J309 Allergic rhinitis, unspecified: Secondary | ICD-10-CM

## 2012-12-17 ENCOUNTER — Ambulatory Visit (INDEPENDENT_AMBULATORY_CARE_PROVIDER_SITE_OTHER): Payer: BC Managed Care – PPO | Admitting: Internal Medicine

## 2012-12-17 DIAGNOSIS — J309 Allergic rhinitis, unspecified: Secondary | ICD-10-CM

## 2012-12-20 ENCOUNTER — Ambulatory Visit: Payer: BC Managed Care – PPO | Admitting: Internal Medicine

## 2013-01-08 ENCOUNTER — Other Ambulatory Visit: Payer: Self-pay | Admitting: Internal Medicine

## 2013-01-08 ENCOUNTER — Ambulatory Visit (INDEPENDENT_AMBULATORY_CARE_PROVIDER_SITE_OTHER): Payer: BC Managed Care – PPO | Admitting: Internal Medicine

## 2013-01-08 DIAGNOSIS — J309 Allergic rhinitis, unspecified: Secondary | ICD-10-CM

## 2013-01-29 ENCOUNTER — Ambulatory Visit (INDEPENDENT_AMBULATORY_CARE_PROVIDER_SITE_OTHER): Payer: BC Managed Care – PPO | Admitting: Internal Medicine

## 2013-01-29 DIAGNOSIS — J309 Allergic rhinitis, unspecified: Secondary | ICD-10-CM

## 2013-02-19 ENCOUNTER — Ambulatory Visit (INDEPENDENT_AMBULATORY_CARE_PROVIDER_SITE_OTHER): Payer: BC Managed Care – PPO | Admitting: Internal Medicine

## 2013-02-19 DIAGNOSIS — J309 Allergic rhinitis, unspecified: Secondary | ICD-10-CM

## 2013-02-28 ENCOUNTER — Other Ambulatory Visit: Payer: BC Managed Care – PPO | Admitting: Internal Medicine

## 2013-02-28 DIAGNOSIS — Z1329 Encounter for screening for other suspected endocrine disorder: Secondary | ICD-10-CM

## 2013-02-28 DIAGNOSIS — Z130 Encounter for screening for diseases of the blood and blood-forming organs and certain disorders involving the immune mechanism: Secondary | ICD-10-CM

## 2013-02-28 DIAGNOSIS — Z13220 Encounter for screening for lipoid disorders: Secondary | ICD-10-CM

## 2013-02-28 DIAGNOSIS — I10 Essential (primary) hypertension: Secondary | ICD-10-CM

## 2013-02-28 LAB — CBC WITH DIFFERENTIAL/PLATELET
Basophils Absolute: 0 10*3/uL (ref 0.0–0.1)
Basophils Relative: 0 % (ref 0–1)
Eosinophils Absolute: 0.2 10*3/uL (ref 0.0–0.7)
Eosinophils Relative: 3 % (ref 0–5)
HCT: 36.8 % (ref 36.0–46.0)
Hemoglobin: 12.4 g/dL (ref 12.0–15.0)
Lymphocytes Relative: 22 % (ref 12–46)
Lymphs Abs: 1.4 10*3/uL (ref 0.7–4.0)
MCH: 30 pg (ref 26.0–34.0)
MCHC: 33.7 g/dL (ref 30.0–36.0)
MCV: 88.9 fL (ref 78.0–100.0)
Monocytes Absolute: 0.4 10*3/uL (ref 0.1–1.0)
Monocytes Relative: 6 % (ref 3–12)
Neutro Abs: 4.5 10*3/uL (ref 1.7–7.7)
Neutrophils Relative %: 69 % (ref 43–77)
Platelets: 283 10*3/uL (ref 150–400)
RBC: 4.14 MIL/uL (ref 3.87–5.11)
RDW: 13.7 % (ref 11.5–15.5)
WBC: 6.5 10*3/uL (ref 4.0–10.5)

## 2013-02-28 LAB — COMPREHENSIVE METABOLIC PANEL
ALT: 15 U/L (ref 0–35)
AST: 19 U/L (ref 0–37)
Alkaline Phosphatase: 71 U/L (ref 39–117)
BUN: 15 mg/dL (ref 6–23)
Calcium: 9.5 mg/dL (ref 8.4–10.5)
Creat: 0.76 mg/dL (ref 0.50–1.10)
Total Protein: 6.7 g/dL (ref 6.0–8.3)

## 2013-02-28 LAB — LIPID PANEL
Cholesterol: 175 mg/dL (ref 0–200)
Total CHOL/HDL Ratio: 1.8 Ratio
Triglycerides: 46 mg/dL (ref <150)

## 2013-03-01 ENCOUNTER — Encounter: Payer: BC Managed Care – PPO | Admitting: Internal Medicine

## 2013-03-01 LAB — VITAMIN D 25 HYDROXY (VIT D DEFICIENCY, FRACTURES): Vit D, 25-Hydroxy: 40 ng/mL (ref 30–89)

## 2013-03-11 ENCOUNTER — Ambulatory Visit (INDEPENDENT_AMBULATORY_CARE_PROVIDER_SITE_OTHER): Payer: BC Managed Care – PPO | Admitting: Internal Medicine

## 2013-03-11 ENCOUNTER — Encounter: Payer: Self-pay | Admitting: Internal Medicine

## 2013-03-11 VITALS — BP 132/98 | HR 80 | Temp 99.1°F | Ht 64.5 in | Wt 150.0 lb

## 2013-03-11 DIAGNOSIS — J309 Allergic rhinitis, unspecified: Secondary | ICD-10-CM

## 2013-03-11 DIAGNOSIS — Z0000 Encounter for general adult medical examination without abnormal findings: Secondary | ICD-10-CM

## 2013-03-11 DIAGNOSIS — I10 Essential (primary) hypertension: Secondary | ICD-10-CM

## 2013-03-11 DIAGNOSIS — Z8669 Personal history of other diseases of the nervous system and sense organs: Secondary | ICD-10-CM

## 2013-03-11 LAB — POCT URINALYSIS DIPSTICK
Bilirubin, UA: NEGATIVE
Blood, UA: NEGATIVE
Glucose, UA: NEGATIVE
Ketones, UA: NEGATIVE
Nitrite, UA: NEGATIVE
Urobilinogen, UA: NEGATIVE

## 2013-03-11 NOTE — Progress Notes (Signed)
  Subjective:    Patient ID: Ann Chambers, female    DOB: Sep 08, 1960, 52 y.o.   MRN: [id number]  Hypertension   Says blood pressure has been running low recently and she has cut antihypertensive medication in half. Blood pressures a bit elevated today but she will keep an eye on it and let us know if she needs to increase dose again. History of allergic rhinitis. Receives allergy injections through this office but serum is prepared bile of our allergy. Skin testing in 2011 showed reactions to grass pollens, weeds, a large reaction to dust mites. Intradermal testing revealed positive reactions to tree pollens, house dust, mold spores.  No known drug allergies  Had colonoscopy 07/29/1996  Appendectomy at [facility] 1996  Right shoulder surgery October 2005 and another right shoulder surgery in 2002.  Seen emergency department with acute abdominal pain December 1997. CT showed thickening of the midportion of the ascending colon. Subsequent colonoscopy was negative.  Gets annual mammogram.  History of lactose intolerance and irritable bowel syndrome. History of migraine headaches.  Social history: Married, no children, patient works as a Careers adviser. Nonsmoker. Social alcohol consumption.  Family history: Father with history of coronary artery bypass surgery passed away 5 years ago. Mother had viral cardiomyopathy. She's doing fine. One brother in good health. One sister in good health. Both parents and sisters all have hypertension.    Review of Systems  Constitutional: Negative.   All other systems reviewed and are negative.       Objective:   Physical Exam  Vitals reviewed. Constitutional: She is oriented to person, place, and time. She appears well-developed and well-nourished. No distress.  HENT:  Head: Normocephalic and atraumatic.  Right Ear: External ear normal.  Left Ear: External ear normal.  Mouth/Throat: Oropharynx is clear and moist. No oropharyngeal  exudate.  Eyes: Conjunctivae and EOM are normal. Pupils are equal, round, and reactive to light. Right eye exhibits no discharge. Left eye exhibits no discharge. No scleral icterus.  Neck: Neck supple. No JVD present. No thyromegaly present.  Cardiovascular: Normal rate, regular rhythm, normal heart sounds and intact distal pulses.   Pulmonary/Chest: Effort normal and breath sounds normal. No respiratory distress. She has no wheezes. She has no rales. She exhibits no tenderness.  Breasts normal female  Abdominal: Soft. Bowel sounds are normal. She exhibits no distension and no mass. There is no tenderness. There is no rebound and no guarding.  Genitourinary:  Deferred to GYN  Musculoskeletal: Normal range of motion. She exhibits no edema.  Lymphadenopathy:    She has no cervical adenopathy.  Neurological: She is alert and oriented to person, place, and time. She has normal reflexes. No cranial nerve deficit. Coordination normal.  Skin: Skin is warm and dry. No rash noted. She is not diaphoretic.  Psychiatric: She has a normal mood and affect. Her behavior is normal. Judgment and thought content normal.          Assessment & Plan:  HTN- has cut BP med in half because of low BPs at home. BP is elevated today. He is to keep an eye on it. She is to return in 6 months. For now we'll keep her on half regular dose of antihypertensive medication  Allergic rhinitis-allergy injections given today.  Flu vaccine-she is allergic to exam cannot take it.

## 2013-03-11 NOTE — Patient Instructions (Signed)
Continue one half tablet of Diovan 80 mg daily. Blood pressure increases, go up to 80 mg Diovan daily. Otherwise return in 6 months. Continue weekly allergy injections. Consider colonoscopy. Recommend annual mammogram. Cannot have flu vaccine due to an egg allergy

## 2013-04-02 ENCOUNTER — Ambulatory Visit (INDEPENDENT_AMBULATORY_CARE_PROVIDER_SITE_OTHER): Payer: BC Managed Care – PPO | Admitting: Internal Medicine

## 2013-04-02 VITALS — BP 110/80

## 2013-04-02 DIAGNOSIS — I10 Essential (primary) hypertension: Secondary | ICD-10-CM

## 2013-04-02 DIAGNOSIS — J309 Allergic rhinitis, unspecified: Secondary | ICD-10-CM

## 2013-04-03 ENCOUNTER — Encounter: Payer: Self-pay | Admitting: Internal Medicine

## 2013-04-03 NOTE — Progress Notes (Signed)
  Subjective:    Patient ID: Ann Chambers, female    DOB: 1960-06-16, 52 y.o.   MRN: [id number]  HPI allergy vaccine given in each arm. This is a new vial of each preparation. Blood pressure 110/80. History of hypertension.    Review of Systems     Objective:   Physical Exam observed patient for 20 minutes after allergy injections in each arm. Blood pressure stable at 110/80        Assessment & Plan:  Allergic rhinitis  Hypertension  See allergy injection form. Patient says she will return in  November 26 for  allergy injections.

## 2013-04-03 NOTE — Patient Instructions (Addendum)
Return November 26 for allergy injections. Continue antihypertensive medication.

## 2013-04-24 ENCOUNTER — Ambulatory Visit (INDEPENDENT_AMBULATORY_CARE_PROVIDER_SITE_OTHER): Payer: BC Managed Care – PPO | Admitting: Internal Medicine

## 2013-04-24 ENCOUNTER — Encounter: Payer: Self-pay | Admitting: Internal Medicine

## 2013-04-24 DIAGNOSIS — I10 Essential (primary) hypertension: Secondary | ICD-10-CM

## 2013-04-24 DIAGNOSIS — J309 Allergic rhinitis, unspecified: Secondary | ICD-10-CM

## 2013-04-24 NOTE — Progress Notes (Signed)
   Subjective:    Patient ID: Ann Chambers, female    DOB: 01-30-1961, 52 y.o.   MRN: [id number]  HPI for allergy injections. Has allergies to trees grass weeds and dust and dust mites.    Review of Systems     Objective:   Physical Exam not examined        Assessment & Plan:  0.1 mL allergy vaccine subcutaneous for trees grass weeds given in left arm and 0.1 mL allergy vaccine subcutaneous for dust and dust mites right arm. No reactions noted.

## 2013-04-24 NOTE — Patient Instructions (Signed)
Return for allergy vaccines as scheduled

## 2013-05-10 ENCOUNTER — Telehealth: Payer: Self-pay | Admitting: Internal Medicine

## 2013-05-10 NOTE — Telephone Encounter (Signed)
Noted  

## 2013-05-17 ENCOUNTER — Ambulatory Visit (INDEPENDENT_AMBULATORY_CARE_PROVIDER_SITE_OTHER): Payer: BC Managed Care – PPO | Admitting: Internal Medicine

## 2013-05-17 DIAGNOSIS — J309 Allergic rhinitis, unspecified: Secondary | ICD-10-CM

## 2013-06-02 NOTE — Progress Notes (Signed)
   Subjective:    Patient ID: Ann Chambers, female    DOB: 1960/06/22, 54 y.o.   MRN: [id number]  HPI For allergy injections today. Given by physician.    Review of Systems     Objective:   Physical Exam Not examined. Blood pressure stable. No reaction to injections       Assessment & Plan:  Allergic rhinitis  Plan: Return when schedule permits for repeat allergy injections

## 2013-06-03 ENCOUNTER — Ambulatory Visit (INDEPENDENT_AMBULATORY_CARE_PROVIDER_SITE_OTHER): Payer: BC Managed Care – PPO | Admitting: Internal Medicine

## 2013-06-03 DIAGNOSIS — J309 Allergic rhinitis, unspecified: Secondary | ICD-10-CM

## 2013-06-14 ENCOUNTER — Ambulatory Visit (INDEPENDENT_AMBULATORY_CARE_PROVIDER_SITE_OTHER): Payer: BC Managed Care – PPO | Admitting: Internal Medicine

## 2013-06-14 DIAGNOSIS — J309 Allergic rhinitis, unspecified: Secondary | ICD-10-CM

## 2013-06-27 ENCOUNTER — Ambulatory Visit: Payer: BC Managed Care – PPO | Admitting: Internal Medicine

## 2013-07-04 ENCOUNTER — Ambulatory Visit: Payer: BC Managed Care – PPO | Admitting: Internal Medicine

## 2013-07-05 ENCOUNTER — Ambulatory Visit (INDEPENDENT_AMBULATORY_CARE_PROVIDER_SITE_OTHER): Payer: BC Managed Care – PPO | Admitting: Internal Medicine

## 2013-07-05 DIAGNOSIS — J309 Allergic rhinitis, unspecified: Secondary | ICD-10-CM

## 2013-07-25 ENCOUNTER — Ambulatory Visit: Payer: BC Managed Care – PPO | Admitting: Internal Medicine

## 2013-07-30 ENCOUNTER — Ambulatory Visit (INDEPENDENT_AMBULATORY_CARE_PROVIDER_SITE_OTHER): Payer: BC Managed Care – PPO | Admitting: Internal Medicine

## 2013-07-30 DIAGNOSIS — J310 Chronic rhinitis: Secondary | ICD-10-CM

## 2013-08-22 ENCOUNTER — Ambulatory Visit (INDEPENDENT_AMBULATORY_CARE_PROVIDER_SITE_OTHER): Payer: BC Managed Care – PPO | Admitting: Internal Medicine

## 2013-08-22 DIAGNOSIS — J309 Allergic rhinitis, unspecified: Secondary | ICD-10-CM

## 2013-09-07 ENCOUNTER — Other Ambulatory Visit: Payer: Self-pay | Admitting: Internal Medicine

## 2013-09-09 ENCOUNTER — Ambulatory Visit: Payer: BC Managed Care – PPO | Admitting: Internal Medicine

## 2013-09-13 ENCOUNTER — Ambulatory Visit (INDEPENDENT_AMBULATORY_CARE_PROVIDER_SITE_OTHER): Payer: BC Managed Care – PPO | Admitting: Internal Medicine

## 2013-09-13 ENCOUNTER — Encounter: Payer: Self-pay | Admitting: Internal Medicine

## 2013-09-13 VITALS — BP 130/84 | HR 84 | Temp 98.4°F | Wt 153.0 lb

## 2013-09-13 DIAGNOSIS — J309 Allergic rhinitis, unspecified: Secondary | ICD-10-CM

## 2013-09-13 DIAGNOSIS — I10 Essential (primary) hypertension: Secondary | ICD-10-CM

## 2013-09-13 NOTE — Progress Notes (Signed)
   Subjective:    Patient ID: Ann Chambers, female    DOB: [birth year], 53 y.o.   MRN: [id number]  HPI Patient in today for six-month recheck on hypertension. She is on Diovan 40 mg daily. Blood pressure is acceptable on current regimen. Has situational stress with mother who is under the care of hospice with congestive heart failure. Father died a few years ago with history of prostate cancer. He was on Coumadin, had a fall and suffered a cerebral hemorrhage. Patient is dealing with mother's illness quite well. Does not need any Xanax for anxiety although the situation is quite stressful at present time. Her sister helps. She's also here today for an allergy injection.    Review of Systems     Objective:   Physical Exam Skin warm and dry. Nodes none. HEENT exam negative neck supple no thyromegaly or adenopathy. Chest clear to auscultation. Cardiac exam regular rate and rhythm normal S1 and S2. Without edema       Assessment & Plan:  Hypertension-stable  History of allergic rhinitis-allergy injections given today  History of migraine headaches treated with ask her  Plan: Allergy injections given today and return in 1-2 weeks. Continue Diovan 40 mg daily. Patient will monitor her blood pressure at home some. Generally has not been doing this. She does work out at [employer] on a regular basis.

## 2013-09-13 NOTE — Patient Instructions (Signed)
Continue same medications and return in 6 months 

## 2013-10-04 ENCOUNTER — Ambulatory Visit (INDEPENDENT_AMBULATORY_CARE_PROVIDER_SITE_OTHER): Payer: BC Managed Care – PPO | Admitting: Internal Medicine

## 2013-10-04 ENCOUNTER — Encounter: Payer: Self-pay | Admitting: Internal Medicine

## 2013-10-04 VITALS — BP 136/84 | HR 68 | Wt 153.0 lb

## 2013-10-04 DIAGNOSIS — J309 Allergic rhinitis, unspecified: Secondary | ICD-10-CM

## 2013-10-04 NOTE — Patient Instructions (Signed)
We will continue to monitor your blood pressure during regular allergy injection appointments. Call if you need anything for anxiety.

## 2013-10-04 NOTE — Progress Notes (Signed)
   Subjective:    Patient ID: Ann Chambers, female    DOB: 1960/07/03, 53 y.o.   MRN: [id number]  HPI In today for allergy injections and blood pressure check. Blood pressure stable at 136/84 on Diovan 40 mg daily. She's under stress with mother under hospice care. She's only been working part-time but needs to return to full-time work soon. I asked if she needed something for anxiety and she declined.    Review of Systems     Objective:   Physical Exam not examined        Assessment & Plan:  Hypertension-stable on Diovan 40 mg daily  Situational stress  Plan: She has booked next allergy injections in 3 weeks. We are happy to check her blood pressure when she is here for that.  Spent 15 minutes speaking with patient about situational stress and offering support

## 2013-10-25 ENCOUNTER — Ambulatory Visit (INDEPENDENT_AMBULATORY_CARE_PROVIDER_SITE_OTHER): Payer: BC Managed Care – PPO | Admitting: Internal Medicine

## 2013-10-25 DIAGNOSIS — J309 Allergic rhinitis, unspecified: Secondary | ICD-10-CM

## 2013-11-11 ENCOUNTER — Ambulatory Visit (INDEPENDENT_AMBULATORY_CARE_PROVIDER_SITE_OTHER): Payer: BC Managed Care – PPO | Admitting: Internal Medicine

## 2013-11-11 DIAGNOSIS — J309 Allergic rhinitis, unspecified: Secondary | ICD-10-CM

## 2013-11-28 ENCOUNTER — Telehealth: Payer: Self-pay | Admitting: Internal Medicine

## 2013-11-28 NOTE — Telephone Encounter (Signed)
Called pt to confirm appointment 12/02/13, she wanted Dr. Renold Genta and staff informed that her mom, Drexel Iha 12-21-1929 DOB, died on 12/04/13.

## 2013-12-02 ENCOUNTER — Ambulatory Visit (INDEPENDENT_AMBULATORY_CARE_PROVIDER_SITE_OTHER): Payer: BC Managed Care – PPO | Admitting: Internal Medicine

## 2013-12-02 DIAGNOSIS — J301 Allergic rhinitis due to pollen: Secondary | ICD-10-CM

## 2013-12-23 ENCOUNTER — Ambulatory Visit (INDEPENDENT_AMBULATORY_CARE_PROVIDER_SITE_OTHER): Payer: BC Managed Care – PPO | Admitting: Internal Medicine

## 2013-12-23 DIAGNOSIS — J3089 Other allergic rhinitis: Secondary | ICD-10-CM

## 2014-01-13 ENCOUNTER — Ambulatory Visit (INDEPENDENT_AMBULATORY_CARE_PROVIDER_SITE_OTHER): Payer: BC Managed Care – PPO | Admitting: Internal Medicine

## 2014-01-13 DIAGNOSIS — J3089 Other allergic rhinitis: Secondary | ICD-10-CM

## 2014-02-06 ENCOUNTER — Telehealth: Payer: Self-pay

## 2014-02-06 ENCOUNTER — Encounter: Payer: Self-pay | Admitting: Internal Medicine

## 2014-02-06 ENCOUNTER — Ambulatory Visit (INDEPENDENT_AMBULATORY_CARE_PROVIDER_SITE_OTHER): Payer: BC Managed Care – PPO | Admitting: Internal Medicine

## 2014-02-06 VITALS — BP 118/78

## 2014-02-06 DIAGNOSIS — J309 Allergic rhinitis, unspecified: Secondary | ICD-10-CM

## 2014-02-06 DIAGNOSIS — J301 Allergic rhinitis due to pollen: Secondary | ICD-10-CM

## 2014-02-06 NOTE — Telephone Encounter (Signed)
Spoke with [facility] and they will mail refills of patients allergy medication to the office.

## 2014-02-06 NOTE — Progress Notes (Signed)
Patient here for allergy shots.  She receive TGW in her left arm and DM in her right arm.  Patient tolerated well.

## 2014-03-04 ENCOUNTER — Ambulatory Visit: Payer: BC Managed Care – PPO | Admitting: Internal Medicine

## 2014-03-06 ENCOUNTER — Ambulatory Visit (INDEPENDENT_AMBULATORY_CARE_PROVIDER_SITE_OTHER): Payer: BC Managed Care – PPO | Admitting: Internal Medicine

## 2014-03-06 ENCOUNTER — Encounter: Payer: Self-pay | Admitting: Internal Medicine

## 2014-03-06 VITALS — BP 118/80 | HR 70 | Temp 98.3°F | Ht 64.5 in | Wt 153.0 lb

## 2014-03-06 DIAGNOSIS — J302 Other seasonal allergic rhinitis: Secondary | ICD-10-CM

## 2014-03-06 DIAGNOSIS — J309 Allergic rhinitis, unspecified: Secondary | ICD-10-CM

## 2014-03-06 NOTE — Progress Notes (Signed)
Patient here for allergy shots. She receive TGW in her left arm and DM in her right arm. Patient tolerated well.

## 2014-03-18 ENCOUNTER — Other Ambulatory Visit: Payer: BC Managed Care – PPO | Admitting: Internal Medicine

## 2014-03-18 DIAGNOSIS — I10 Essential (primary) hypertension: Secondary | ICD-10-CM

## 2014-03-18 DIAGNOSIS — Z0000 Encounter for general adult medical examination without abnormal findings: Secondary | ICD-10-CM

## 2014-03-18 DIAGNOSIS — Z1329 Encounter for screening for other suspected endocrine disorder: Secondary | ICD-10-CM

## 2014-03-18 DIAGNOSIS — Z13220 Encounter for screening for lipoid disorders: Secondary | ICD-10-CM

## 2014-03-18 DIAGNOSIS — Z1321 Encounter for screening for nutritional disorder: Secondary | ICD-10-CM

## 2014-03-18 DIAGNOSIS — Z130 Encounter for screening for diseases of the blood and blood-forming organs and certain disorders involving the immune mechanism: Secondary | ICD-10-CM

## 2014-03-18 LAB — COMPREHENSIVE METABOLIC PANEL
ALBUMIN: 4.1 g/dL (ref 3.5–5.2)
ALK PHOS: 80 U/L (ref 39–117)
ALT: 17 U/L (ref 0–35)
AST: 21 U/L (ref 0–37)
BILIRUBIN TOTAL: 0.7 mg/dL (ref 0.2–1.2)
BUN: 16 mg/dL (ref 6–23)
CO2: 25 mEq/L (ref 19–32)
CREATININE: 0.71 mg/dL (ref 0.50–1.10)
Calcium: 9.3 mg/dL (ref 8.4–10.5)
Chloride: 104 mEq/L (ref 96–112)
GLUCOSE: 95 mg/dL (ref 70–99)
Potassium: 4.4 mEq/L (ref 3.5–5.3)
SODIUM: 140 meq/L (ref 135–145)
TOTAL PROTEIN: 6.7 g/dL (ref 6.0–8.3)

## 2014-03-18 LAB — CBC WITH DIFFERENTIAL/PLATELET
BASOS ABS: 0.1 10*3/uL (ref 0.0–0.1)
BASOS PCT: 1 % (ref 0–1)
EOS ABS: 0.3 10*3/uL (ref 0.0–0.7)
Eosinophils Relative: 4 % (ref 0–5)
HEMATOCRIT: 37.8 % (ref 36.0–46.0)
Hemoglobin: 12.4 g/dL (ref 12.0–15.0)
LYMPHS PCT: 21 % (ref 12–46)
Lymphs Abs: 1.4 10*3/uL (ref 0.7–4.0)
MCH: 30 pg (ref 26.0–34.0)
MCHC: 32.8 g/dL (ref 30.0–36.0)
MCV: 91.3 fL (ref 78.0–100.0)
MONO ABS: 0.4 10*3/uL (ref 0.1–1.0)
Monocytes Relative: 6 % (ref 3–12)
NEUTROS PCT: 68 % (ref 43–77)
Neutro Abs: 4.6 10*3/uL (ref 1.7–7.7)
PLATELETS: 288 10*3/uL (ref 150–400)
RBC: 4.14 MIL/uL (ref 3.87–5.11)
RDW: 13.1 % (ref 11.5–15.5)
WBC: 6.8 10*3/uL (ref 4.0–10.5)

## 2014-03-18 LAB — LIPID PANEL
CHOLESTEROL: 173 mg/dL (ref 0–200)
HDL: 96 mg/dL (ref >39)
LDL Cholesterol: 68 mg/dL (ref 0–99)
TRIGLYCERIDES: 44 mg/dL (ref <150)
Total CHOL/HDL Ratio: 1.8 Ratio
VLDL: 9 mg/dL (ref 0–40)

## 2014-03-18 LAB — TSH: TSH: 1.628 u[IU]/mL (ref 0.350–4.500)

## 2014-03-19 LAB — VITAMIN D 25 HYDROXY (VIT D DEFICIENCY, FRACTURES): Vit D, 25-Hydroxy: 50 ng/mL (ref 30–89)

## 2014-03-20 ENCOUNTER — Encounter: Payer: Self-pay | Admitting: Internal Medicine

## 2014-03-20 ENCOUNTER — Ambulatory Visit (INDEPENDENT_AMBULATORY_CARE_PROVIDER_SITE_OTHER): Payer: BC Managed Care – PPO | Admitting: Internal Medicine

## 2014-03-20 VITALS — BP 108/74 | HR 76 | Temp 98.4°F | Ht 63.5 in | Wt 148.0 lb

## 2014-03-20 DIAGNOSIS — M545 Low back pain, unspecified: Secondary | ICD-10-CM

## 2014-03-20 DIAGNOSIS — Z0000 Encounter for general adult medical examination without abnormal findings: Secondary | ICD-10-CM

## 2014-03-20 DIAGNOSIS — Z8669 Personal history of other diseases of the nervous system and sense organs: Secondary | ICD-10-CM

## 2014-03-20 DIAGNOSIS — R0782 Intercostal pain: Secondary | ICD-10-CM

## 2014-03-20 DIAGNOSIS — M4120 Other idiopathic scoliosis, site unspecified: Secondary | ICD-10-CM

## 2014-03-20 DIAGNOSIS — I10 Essential (primary) hypertension: Secondary | ICD-10-CM

## 2014-03-20 DIAGNOSIS — J309 Allergic rhinitis, unspecified: Secondary | ICD-10-CM

## 2014-03-20 LAB — POCT URINALYSIS DIPSTICK
BILIRUBIN UA: NEGATIVE
Glucose, UA: NEGATIVE
KETONES UA: NEGATIVE
LEUKOCYTES UA: NEGATIVE
Nitrite, UA: NEGATIVE
PROTEIN UA: NEGATIVE
RBC UA: NEGATIVE
Spec Grav, UA: <=1.005
Urobilinogen, UA: NEGATIVE
pH, UA: 6

## 2014-03-20 NOTE — Patient Instructions (Addendum)
PT as indicated for back pain. RTC in 6 months or as needed. Continue allergy vaccine. Continue medication for hypertension.

## 2014-04-10 ENCOUNTER — Ambulatory Visit: Payer: BC Managed Care – PPO | Admitting: Internal Medicine

## 2014-04-10 DIAGNOSIS — J302 Other seasonal allergic rhinitis: Secondary | ICD-10-CM

## 2014-04-28 NOTE — Progress Notes (Signed)
Subjective:    Patient ID: Ann Chambers, female    DOB: 12/30/1960, 53 y.o.   MRN: [id number]  HPI  53 year old Female in today for health maintenance exam. She has a history of allergic rhinitis and takes allergy injections here as prescribed by [facility]. She has a history of hypertension well controlled on current regimen. Patient has a history of scoliosis. She's been having massage therapy. Issues with back pain. Occasionally has had some chest pain. She is concerned because father had history of MI. EKG done today is within normal limits. Some issues with right hip and right leg pain.  Allergy skin tests in 2011 showed reactions to grass pollens, weeds, large reaction to dust mites. Intradermal testing revealed positive reactions to tree pollens, house dust, mold spores.  No known drug allergies  Colonoscopy done in 1998  Appendectomy at [facility]  Right shoulder surgery October 2005 and another right shoulder surgery in 2002.  History of migraine headaches  History of Y Actos intolerance and irritable bowel syndrome.  Patient was seen in the emergency department with acute abdominal pain in December 1997. CT showed thickening of the midportion of the ascending colon. That is why she proceeded with colonoscopy in 1998.  Social history: Married, no children, works as a Chief Technology Officer. Nonsmoker. Social alcohol consumption.  Family history: Father with history of hypertension, MI, CABG, died with cerebral hemorrhage. Mother with history of cardiomyopathy and congestive heart failure. Died at 69 years of age. One brother in good health. One sister in good health. Both parents with history of hypertension. Sister with hypertension.    Review of Systems  Eyes: Negative.   Respiratory: Negative.   Cardiovascular: Positive for chest pain.  Musculoskeletal: Positive for back pain.  Allergic/Immunologic: Positive for environmental allergies.       Objective:   Physical Exam  Constitutional: She is oriented to person, place, and time. She appears well-developed and well-nourished. No distress.  HENT:  Head: Normocephalic and atraumatic.  Right Ear: External ear normal.  Left Ear: External ear normal.  Mouth/Throat: Oropharynx is clear and moist. No oropharyngeal exudate.  Eyes: Conjunctivae and EOM are normal. Pupils are equal, round, and reactive to light. Right eye exhibits no discharge. Left eye exhibits no discharge. No scleral icterus.  Neck: Neck supple. No JVD present. No thyromegaly present.  Cardiovascular: Normal rate, normal heart sounds and intact distal pulses.   No murmur heard. Pulmonary/Chest: Effort normal and breath sounds normal. No respiratory distress. She has no wheezes. She has no rales. She exhibits no tenderness.  Breasts normal female. Palpable chest wall pain  Abdominal: Soft. Bowel sounds are normal. She exhibits no distension and no mass. There is no tenderness. There is no rebound and no guarding.  Genitourinary:  Deferred to GYN  Musculoskeletal: Normal range of motion. She exhibits no edema.  Lymphadenopathy:    She has no cervical adenopathy.  Neurological: She is alert and oriented to person, place, and time. She has normal reflexes. No cranial nerve deficit. Coordination normal.  Skin: Skin is warm and dry. No rash noted. She is not diaphoretic.  Psychiatric: She has a normal mood and affect. Her behavior is normal. Judgment and thought content normal.  Vitals reviewed.         Assessment & Plan:  Chest wall pain-EKG is within normal limits  Back pain-history of scoliosis. Order given for physical therapy  Allergic rhinitis-takes allergy injections  Hypertension-stable on current regimen  History of migraine  headaches  Plan: Return in 6 months or as needed.  Allergic to eggs and cannot take flu vaccine.

## 2014-05-06 ENCOUNTER — Ambulatory Visit (INDEPENDENT_AMBULATORY_CARE_PROVIDER_SITE_OTHER): Payer: BC Managed Care – PPO | Admitting: Internal Medicine

## 2014-05-06 ENCOUNTER — Encounter: Payer: Self-pay | Admitting: Internal Medicine

## 2014-05-06 VITALS — BP 122/82 | HR 79 | Temp 98.0°F | Wt 148.0 lb

## 2014-05-06 DIAGNOSIS — J309 Allergic rhinitis, unspecified: Secondary | ICD-10-CM

## 2014-05-06 NOTE — Progress Notes (Signed)
Patient ID: Ann Chambers, female   DOB: Apr 21, 1961, 53 y.o.   MRN: [id number] Patient seen today for immunotherapy.  She was given 0.5cc of tree, weed, mold and dust.  She tolerated well.  No reaction.

## 2014-05-27 ENCOUNTER — Ambulatory Visit (INDEPENDENT_AMBULATORY_CARE_PROVIDER_SITE_OTHER): Payer: BC Managed Care – PPO | Admitting: Internal Medicine

## 2014-05-27 DIAGNOSIS — J301 Allergic rhinitis due to pollen: Secondary | ICD-10-CM

## 2014-05-27 DIAGNOSIS — J309 Allergic rhinitis, unspecified: Secondary | ICD-10-CM

## 2014-06-13 ENCOUNTER — Encounter: Payer: Self-pay | Admitting: Internal Medicine

## 2014-06-19 ENCOUNTER — Ambulatory Visit (INDEPENDENT_AMBULATORY_CARE_PROVIDER_SITE_OTHER): Payer: BLUE CROSS/BLUE SHIELD | Admitting: Internal Medicine

## 2014-06-19 DIAGNOSIS — J309 Allergic rhinitis, unspecified: Secondary | ICD-10-CM | POA: Diagnosis not present

## 2014-06-19 NOTE — Progress Notes (Signed)
Patient ID: Ann Chambers, female   DOB: March 31, 1961, 54 y.o.   MRN: [id number] Patient presents today for allergy injections . Patient receives 2 injections that are supplied by patient. Patient waited 20 minutes. Patient had no reactions to injections.

## 2014-07-07 ENCOUNTER — Ambulatory Visit (INDEPENDENT_AMBULATORY_CARE_PROVIDER_SITE_OTHER): Payer: BLUE CROSS/BLUE SHIELD | Admitting: Internal Medicine

## 2014-07-07 VITALS — BP 110/70 | Temp 98.0°F

## 2014-07-07 DIAGNOSIS — J309 Allergic rhinitis, unspecified: Secondary | ICD-10-CM

## 2014-07-07 DIAGNOSIS — J301 Allergic rhinitis due to pollen: Secondary | ICD-10-CM

## 2014-07-07 NOTE — Progress Notes (Signed)
Patient presents today for allergy injections. Patient waited 20 minutes after injections and had no reaction to injections. Patient tolerated well.

## 2014-07-28 ENCOUNTER — Ambulatory Visit (INDEPENDENT_AMBULATORY_CARE_PROVIDER_SITE_OTHER): Payer: BLUE CROSS/BLUE SHIELD | Admitting: Internal Medicine

## 2014-07-28 DIAGNOSIS — J309 Allergic rhinitis, unspecified: Secondary | ICD-10-CM

## 2014-07-28 DIAGNOSIS — J302 Other seasonal allergic rhinitis: Secondary | ICD-10-CM

## 2014-07-28 NOTE — Progress Notes (Signed)
Patient presents today for allergy injections. Patient tolerated well.

## 2014-08-12 ENCOUNTER — Ambulatory Visit: Payer: BLUE CROSS/BLUE SHIELD | Admitting: Internal Medicine

## 2014-08-25 ENCOUNTER — Ambulatory Visit (INDEPENDENT_AMBULATORY_CARE_PROVIDER_SITE_OTHER): Payer: BLUE CROSS/BLUE SHIELD | Admitting: Internal Medicine

## 2014-08-25 DIAGNOSIS — J309 Allergic rhinitis, unspecified: Secondary | ICD-10-CM | POA: Diagnosis not present

## 2014-08-25 NOTE — Progress Notes (Signed)
Patient presents today for allergy injections. Tolerated well.

## 2014-08-29 ENCOUNTER — Other Ambulatory Visit: Payer: Self-pay | Admitting: Internal Medicine

## 2014-09-15 ENCOUNTER — Ambulatory Visit: Payer: BLUE CROSS/BLUE SHIELD | Admitting: Internal Medicine

## 2014-09-16 ENCOUNTER — Ambulatory Visit (INDEPENDENT_AMBULATORY_CARE_PROVIDER_SITE_OTHER): Payer: BLUE CROSS/BLUE SHIELD | Admitting: Internal Medicine

## 2014-09-16 DIAGNOSIS — J309 Allergic rhinitis, unspecified: Secondary | ICD-10-CM

## 2014-09-16 NOTE — Progress Notes (Signed)
Patient presents today for allergy injections that are provided by patient. Patient waited 20 min after injections . No reaction at injection sites.

## 2014-10-02 ENCOUNTER — Encounter: Payer: Self-pay | Admitting: Internal Medicine

## 2014-10-02 ENCOUNTER — Ambulatory Visit (INDEPENDENT_AMBULATORY_CARE_PROVIDER_SITE_OTHER): Payer: BLUE CROSS/BLUE SHIELD | Admitting: Internal Medicine

## 2014-10-02 VITALS — BP 108/72 | HR 67 | Temp 98.0°F | Wt 152.0 lb

## 2014-10-02 DIAGNOSIS — H00013 Hordeolum externum right eye, unspecified eyelid: Secondary | ICD-10-CM

## 2014-10-02 MED ORDER — OFLOXACIN 0.3 % OP SOLN: 2.0000 [drp] | Freq: Four times a day (QID) | OPHTHALMIC | Status: DC

## 2014-10-02 NOTE — Progress Notes (Signed)
   Subjective:    Patient ID: Ann Chambers, female    DOB: 1960/06/28, 54 y.o.   MRN: [id number]  HPI  Five-day history Hordeolum right upper eyelid at mid lid area. Has used warm compresses. Not getting better. Is seen some drainage from hordeolum. Also history of allergic rhinitis. Here for allergy injection as well.  Review of Systems     Objective:   Physical Exam  Hordeolum right upper eyelid  At mid eyelid level      Assessment & Plan:  Plan: Continue warm hot compresses 15 minutes several times daily. If not better by next week refer to ophthalmologist. Ocuflox ophthalmic drops 2 drops right eye 4 times daily.  Allergy vaccine given today

## 2014-10-02 NOTE — Patient Instructions (Signed)
Continue warm hot compresses several times daily. Use Ocuflox ophthalmic drops 4 times daily.

## 2014-10-07 ENCOUNTER — Ambulatory Visit: Payer: BLUE CROSS/BLUE SHIELD | Admitting: Internal Medicine

## 2014-10-17 ENCOUNTER — Encounter: Payer: Self-pay | Admitting: Internal Medicine

## 2014-10-20 ENCOUNTER — Ambulatory Visit: Payer: BLUE CROSS/BLUE SHIELD | Admitting: Internal Medicine

## 2014-10-21 ENCOUNTER — Ambulatory Visit: Payer: BLUE CROSS/BLUE SHIELD | Admitting: Internal Medicine

## 2014-10-23 ENCOUNTER — Ambulatory Visit (INDEPENDENT_AMBULATORY_CARE_PROVIDER_SITE_OTHER): Payer: BLUE CROSS/BLUE SHIELD | Admitting: Internal Medicine

## 2014-10-23 VITALS — BP 110/70 | Temp 98.0°F

## 2014-10-23 DIAGNOSIS — J309 Allergic rhinitis, unspecified: Secondary | ICD-10-CM

## 2014-10-23 NOTE — Progress Notes (Signed)
Patient presents today for allergy injections. VS Stable. Tolerated injections well.

## 2014-11-10 ENCOUNTER — Ambulatory Visit (INDEPENDENT_AMBULATORY_CARE_PROVIDER_SITE_OTHER): Payer: BLUE CROSS/BLUE SHIELD | Admitting: Internal Medicine

## 2014-11-10 DIAGNOSIS — J309 Allergic rhinitis, unspecified: Secondary | ICD-10-CM

## 2014-11-10 NOTE — Progress Notes (Signed)
Patient presents today for allergy injections.

## 2014-12-02 ENCOUNTER — Ambulatory Visit: Payer: BLUE CROSS/BLUE SHIELD | Admitting: Internal Medicine

## 2014-12-04 ENCOUNTER — Ambulatory Visit (INDEPENDENT_AMBULATORY_CARE_PROVIDER_SITE_OTHER): Payer: BLUE CROSS/BLUE SHIELD | Admitting: Internal Medicine

## 2014-12-04 DIAGNOSIS — J309 Allergic rhinitis, unspecified: Secondary | ICD-10-CM

## 2014-12-23 ENCOUNTER — Ambulatory Visit (INDEPENDENT_AMBULATORY_CARE_PROVIDER_SITE_OTHER): Payer: BLUE CROSS/BLUE SHIELD | Admitting: Internal Medicine

## 2014-12-23 DIAGNOSIS — J309 Allergic rhinitis, unspecified: Secondary | ICD-10-CM | POA: Diagnosis not present

## 2014-12-23 NOTE — Progress Notes (Signed)
Patient presents today for allergy injections. No reaction at injection sites.

## 2015-01-13 ENCOUNTER — Ambulatory Visit: Payer: Self-pay | Admitting: Internal Medicine

## 2015-01-16 ENCOUNTER — Ambulatory Visit: Payer: BLUE CROSS/BLUE SHIELD | Admitting: Internal Medicine

## 2015-01-19 ENCOUNTER — Ambulatory Visit (INDEPENDENT_AMBULATORY_CARE_PROVIDER_SITE_OTHER): Payer: BLUE CROSS/BLUE SHIELD | Admitting: Internal Medicine

## 2015-01-19 DIAGNOSIS — J309 Allergic rhinitis, unspecified: Secondary | ICD-10-CM

## 2015-01-19 NOTE — Progress Notes (Signed)
Patient presents today for allergy injection. Patient tolerated injections well

## 2015-02-09 ENCOUNTER — Ambulatory Visit (INDEPENDENT_AMBULATORY_CARE_PROVIDER_SITE_OTHER): Payer: BLUE CROSS/BLUE SHIELD | Admitting: Internal Medicine

## 2015-02-09 DIAGNOSIS — J301 Allergic rhinitis due to pollen: Secondary | ICD-10-CM

## 2015-02-09 DIAGNOSIS — J309 Allergic rhinitis, unspecified: Secondary | ICD-10-CM | POA: Diagnosis not present

## 2015-02-09 NOTE — Progress Notes (Signed)
Patient give allergy injections. Patient tolerated injections well.

## 2015-02-26 ENCOUNTER — Telehealth: Payer: Self-pay | Admitting: Internal Medicine

## 2015-02-26 NOTE — Telephone Encounter (Signed)
Ann Chambers is not our pt.

## 2015-02-26 NOTE — Telephone Encounter (Signed)
Not our pt I called [place] office and let them know

## 2015-03-02 ENCOUNTER — Ambulatory Visit (INDEPENDENT_AMBULATORY_CARE_PROVIDER_SITE_OTHER): Payer: BLUE CROSS/BLUE SHIELD | Admitting: Internal Medicine

## 2015-03-02 DIAGNOSIS — J309 Allergic rhinitis, unspecified: Secondary | ICD-10-CM | POA: Diagnosis not present

## 2015-03-20 ENCOUNTER — Other Ambulatory Visit: Payer: BLUE CROSS/BLUE SHIELD | Admitting: Internal Medicine

## 2015-03-23 ENCOUNTER — Other Ambulatory Visit (INDEPENDENT_AMBULATORY_CARE_PROVIDER_SITE_OTHER): Payer: BLUE CROSS/BLUE SHIELD | Admitting: Internal Medicine

## 2015-03-23 VITALS — BP 118/82

## 2015-03-23 DIAGNOSIS — I10 Essential (primary) hypertension: Secondary | ICD-10-CM

## 2015-03-23 DIAGNOSIS — Z0000 Encounter for general adult medical examination without abnormal findings: Secondary | ICD-10-CM

## 2015-03-23 DIAGNOSIS — Z1329 Encounter for screening for other suspected endocrine disorder: Secondary | ICD-10-CM

## 2015-03-23 LAB — LIPID PANEL
Cholesterol: 171 mg/dL (ref 125–200)
HDL: 97 mg/dL (ref >=46)
LDL Cholesterol: 64 mg/dL (ref <130)
Total CHOL/HDL Ratio: 1.8 Ratio (ref <=5.0)
Triglycerides: 48 mg/dL (ref <150)
VLDL: 10 mg/dL (ref <30)

## 2015-03-23 LAB — COMPLETE METABOLIC PANEL WITH GFR
ALBUMIN: 4.3 g/dL (ref 3.6–5.1)
ALK PHOS: 84 U/L (ref 33–130)
ALT: 19 U/L (ref 6–29)
AST: 22 U/L (ref 10–35)
BILIRUBIN TOTAL: 0.8 mg/dL (ref 0.2–1.2)
BUN: 21 mg/dL (ref 7–25)
CO2: 26 mmol/L (ref 20–31)
Calcium: 9.3 mg/dL (ref 8.6–10.4)
Chloride: 104 mmol/L (ref 98–110)
Creat: 0.72 mg/dL (ref 0.50–1.05)
GFR, Est Non African American: >89 mL/min (ref >=60)
GLUCOSE: 83 mg/dL (ref 65–99)
Potassium: 4.6 mmol/L (ref 3.5–5.3)
SODIUM: 143 mmol/L (ref 135–146)
TOTAL PROTEIN: 6.8 g/dL (ref 6.1–8.1)

## 2015-03-23 LAB — CBC WITH DIFFERENTIAL/PLATELET
Basophils Absolute: 0.1 10*3/uL (ref 0.0–0.1)
Basophils Relative: 1 % (ref 0–1)
Eosinophils Absolute: 0.2 10*3/uL (ref 0.0–0.7)
Eosinophils Relative: 4 % (ref 0–5)
HCT: 38.4 % (ref 36.0–46.0)
HEMOGLOBIN: 13.2 g/dL (ref 12.0–15.0)
LYMPHS ABS: 1.4 10*3/uL (ref 0.7–4.0)
LYMPHS PCT: 23 % (ref 12–46)
MCH: 30.9 pg (ref 26.0–34.0)
MCHC: 34.4 g/dL (ref 30.0–36.0)
MCV: 89.9 fL (ref 78.0–100.0)
MPV: 9.3 fL (ref 8.6–12.4)
Monocytes Absolute: 0.4 10*3/uL (ref 0.1–1.0)
Monocytes Relative: 6 % (ref 3–12)
NEUTROS PCT: 66 % (ref 43–77)
Neutro Abs: 4 10*3/uL (ref 1.7–7.7)
Platelets: 295 10*3/uL (ref 150–400)
RBC: 4.27 MIL/uL (ref 3.87–5.11)
RDW: 13.3 % (ref 11.5–15.5)
WBC: 6.1 10*3/uL (ref 4.0–10.5)

## 2015-03-23 LAB — TSH: TSH: 0.937 u[IU]/mL (ref 0.350–4.500)

## 2015-03-23 NOTE — Progress Notes (Signed)
Pt here for allergy shots tolerated well

## 2015-03-24 ENCOUNTER — Encounter: Payer: Self-pay | Admitting: Internal Medicine

## 2015-03-24 ENCOUNTER — Ambulatory Visit (INDEPENDENT_AMBULATORY_CARE_PROVIDER_SITE_OTHER): Payer: BLUE CROSS/BLUE SHIELD | Admitting: Internal Medicine

## 2015-03-24 VITALS — BP 112/78 | HR 81 | Temp 98.5°F | Resp 18 | Ht 64.0 in | Wt 154.0 lb

## 2015-03-24 DIAGNOSIS — Z91012 Allergy to eggs: Secondary | ICD-10-CM | POA: Diagnosis not present

## 2015-03-24 DIAGNOSIS — J309 Allergic rhinitis, unspecified: Secondary | ICD-10-CM

## 2015-03-24 DIAGNOSIS — Z0000 Encounter for general adult medical examination without abnormal findings: Secondary | ICD-10-CM | POA: Diagnosis not present

## 2015-03-24 DIAGNOSIS — I10 Essential (primary) hypertension: Secondary | ICD-10-CM | POA: Diagnosis not present

## 2015-03-24 DIAGNOSIS — Z8669 Personal history of other diseases of the nervous system and sense organs: Secondary | ICD-10-CM

## 2015-03-24 LAB — POCT URINALYSIS DIPSTICK
BILIRUBIN UA: NEGATIVE
Blood, UA: NEGATIVE
Glucose, UA: NEGATIVE
Ketones, UA: NEGATIVE
LEUKOCYTES UA: NEGATIVE
NITRITE UA: NEGATIVE
PH UA: 6
PROTEIN UA: NEGATIVE
Spec Grav, UA: 1.01
Urobilinogen, UA: 0.2

## 2015-03-24 LAB — VITAMIN D 25 HYDROXY (VIT D DEFICIENCY, FRACTURES): Vit D, 25-Hydroxy: 41 ng/mL (ref 30–100)

## 2015-03-25 NOTE — Progress Notes (Signed)
Subjective:    Patient ID: Ann Chambers, female    DOB: Jul 21, 1960, 54 y.o.   MRN: [id number]  HPI Very pleasant 54 year old white female in today for health maintenance exam and evaluation of hypertension. She saw dermatologist, Dr. Tonia Brooms July 2016 regarding annual skin check. There was an area right lateral lower thigh and thought to be a lipoma which was removed and indeed showed mature adipose tissue with delicate vascular spaces and thin septa. No atypia was observed.  She has gynecologist. She has a history of allergic rhinitis and takes allergy injections here as prescribed by [facility].  Hypertension well controlled on Diovan. History of scoliosis. She has massage therapy for issues with back pain.  Allergy skin test in 2011 showed reactions to grass pollens, weeds, large reaction to dust mites. Intradermal testing revealed positive reactions treat pollens, house dust, mold spores.  History of migraine headaches.  No known drug allergies.  At last exam in 2015 had some chest pain and EKG was normal.  Right shoulder surgery October 2005 and another right shoulder surgery in 2002.  Appendectomy at [facility]. Colonoscopy done in 1998 and 2011. History of lactose intolerance and irritable bowel syndrome.  Patient was seen in the emergency department with acute abdominal pain in December 1997. CT showed thickening of the midportion of the a 70:. That is why she proceeded with colonoscopy in 1998.  Social history: Married, no children, is a Academic librarian. Nonsmoker. Social alcohol consumption.  Family history: Father with history of hypertension, MI, CABG died with cerebral hemorrhage. Mother with history of cardiomyopathy and congestive heart failure died at 78 years of age. One brother in good health. One sister in good health. Both parents with history of hypertension. Sister with hypertension.    Review of Systems  Constitutional:  Negative.   Respiratory: Negative.   Cardiovascular: Negative.   Genitourinary: Negative.   Neurological:       History of migraine headaches that are infrequent  Psychiatric/Behavioral: Negative.        Objective:   Physical Exam  Constitutional: She is oriented to person, place, and time. She appears well-developed and well-nourished. No distress.  HENT:  Head: Normocephalic and atraumatic.  Right Ear: External ear normal.  Left Ear: External ear normal.  Mouth/Throat: Oropharynx is clear and moist. No oropharyngeal exudate.  Eyes: Conjunctivae and EOM are normal. Pupils are equal, round, and reactive to light. Right eye exhibits no discharge. Left eye exhibits no discharge. No scleral icterus.  Neck: Neck supple. No JVD present. No thyromegaly present.  Cardiovascular: Normal rate, regular rhythm and normal heart sounds.   No murmur heard. Pulmonary/Chest: Effort normal and breath sounds normal. She has no wheezes. She has no rales.  Breasts normal female without masses  Abdominal: Soft. Bowel sounds are normal. She exhibits no distension. There is no tenderness. There is no rebound and no guarding.  Genitourinary:  Deferred to GYN  Musculoskeletal: She exhibits no edema.  Lymphadenopathy:    She has no cervical adenopathy.  Neurological: She is alert and oriented to person, place, and time. She has normal reflexes. No cranial nerve deficit. Coordination normal.  Skin: Skin is warm and dry. No rash noted. She is not diaphoretic.  Psychiatric: She has a normal mood and affect. Her behavior is normal. Thought content normal.  Vitals reviewed.         Assessment & Plan:  Essential hypertension-stable on Diovan  History of migraine headaches  Allergic rhinitis-multiple  environmental allergies currently on immunotherapy  Plan: Return in 6 months for follow-up on hypertension and continue allergy immunotherapy. Lab work is within normal limits. Cannot take flu vaccine due  to egg allergy

## 2015-03-26 NOTE — Patient Instructions (Signed)
It was a pleasure to see you today. Continue same medications and return in 6 months. Continue allergy immunotherapy.

## 2015-04-13 ENCOUNTER — Ambulatory Visit (INDEPENDENT_AMBULATORY_CARE_PROVIDER_SITE_OTHER): Payer: BLUE CROSS/BLUE SHIELD | Admitting: Internal Medicine

## 2015-04-13 DIAGNOSIS — J309 Allergic rhinitis, unspecified: Secondary | ICD-10-CM | POA: Diagnosis not present

## 2015-04-13 NOTE — Progress Notes (Signed)
Patient ID: Ann Chambers, female   DOB: [birth year], 54 y.o.   MRN: [id number]  0.5cc MDM given right arm SQ pt tolerated well 0.5cc TGW given left arm SQ pt tolerated well

## 2015-05-04 ENCOUNTER — Ambulatory Visit: Payer: BLUE CROSS/BLUE SHIELD | Admitting: Internal Medicine

## 2015-05-05 ENCOUNTER — Ambulatory Visit: Payer: BLUE CROSS/BLUE SHIELD | Admitting: Internal Medicine

## 2015-05-11 ENCOUNTER — Ambulatory Visit (INDEPENDENT_AMBULATORY_CARE_PROVIDER_SITE_OTHER): Payer: BLUE CROSS/BLUE SHIELD | Admitting: Internal Medicine

## 2015-05-11 DIAGNOSIS — J309 Allergic rhinitis, unspecified: Secondary | ICD-10-CM | POA: Diagnosis not present

## 2015-05-11 NOTE — Progress Notes (Signed)
0.5cc MDM given right arm SQ pt tolerated well 0.5cc TGW given left arm SQ pt tolerated well

## 2015-05-21 ENCOUNTER — Encounter: Payer: Self-pay | Admitting: Internal Medicine

## 2015-05-21 ENCOUNTER — Ambulatory Visit (INDEPENDENT_AMBULATORY_CARE_PROVIDER_SITE_OTHER): Payer: BLUE CROSS/BLUE SHIELD | Admitting: Internal Medicine

## 2015-05-21 VITALS — BP 120/84 | HR 75 | Temp 98.5°F | Resp 20 | Ht 64.0 in | Wt 154.0 lb

## 2015-05-21 DIAGNOSIS — J069 Acute upper respiratory infection, unspecified: Secondary | ICD-10-CM | POA: Diagnosis not present

## 2015-05-21 MED ORDER — AZITHROMYCIN 250 MG PO TABS: ORAL_TABLET | ORAL | Status: DC

## 2015-05-27 ENCOUNTER — Encounter: Payer: Self-pay | Admitting: Internal Medicine

## 2015-05-27 NOTE — Patient Instructions (Signed)
Take Zithromax Z-PAK as directed. Call if not better in 7-10 days or sooner if worse. Rest and drink plenty of fluids

## 2015-05-27 NOTE — Progress Notes (Signed)
   Subjective:    Patient ID: Ann Chambers, female    DOB: June 19, 1960, 54 y.o.   MRN: [id number]  HPI Recent onset of maxillary sinus pressure consistent with early sinusitis. No headache. No fever or shaking chills. No significant cough. Stuffy nose. History of allergic rhinitis and receives regular immunotherapy/allergy  injections through this office    Review of Systems     Objective:   Physical Exam  TMs are slightly full bilaterally. Pharynx are slightly injected. Neck is supple without adenopathy. Chest clear to auscultation.      Assessment & Plan:  Acute URI  Plan: Zithromax Z-Pak take 2 tablets day one followed by 1 tablet days 2 through 5. Rest and drink plenty of fluids.

## 2015-05-29 ENCOUNTER — Ambulatory Visit (INDEPENDENT_AMBULATORY_CARE_PROVIDER_SITE_OTHER): Payer: BLUE CROSS/BLUE SHIELD | Admitting: Internal Medicine

## 2015-05-29 DIAGNOSIS — J309 Allergic rhinitis, unspecified: Secondary | ICD-10-CM | POA: Diagnosis not present

## 2015-05-29 NOTE — Progress Notes (Signed)
Patient was given 0.5cc MDM in right arm. Patient tolerated well.   Patient was given 0.5cc TGW in left arm. Patient tolerated well.

## 2015-06-15 ENCOUNTER — Ambulatory Visit: Payer: BLUE CROSS/BLUE SHIELD | Admitting: Internal Medicine

## 2015-06-16 ENCOUNTER — Ambulatory Visit (INDEPENDENT_AMBULATORY_CARE_PROVIDER_SITE_OTHER): Payer: BLUE CROSS/BLUE SHIELD | Admitting: Internal Medicine

## 2015-06-16 DIAGNOSIS — J3089 Other allergic rhinitis: Secondary | ICD-10-CM | POA: Diagnosis not present

## 2015-06-17 NOTE — Progress Notes (Signed)
Pt was given 0.5cc MDM in right arm. Patient tolerated well. Pt was given 0.5cc TGW in left arm. Patient tolerated well.

## 2015-07-06 ENCOUNTER — Ambulatory Visit (INDEPENDENT_AMBULATORY_CARE_PROVIDER_SITE_OTHER): Payer: BLUE CROSS/BLUE SHIELD | Admitting: Internal Medicine

## 2015-07-06 VITALS — BP 114/68

## 2015-07-06 DIAGNOSIS — J3089 Other allergic rhinitis: Secondary | ICD-10-CM | POA: Diagnosis not present

## 2015-07-06 NOTE — Progress Notes (Signed)
Patient given 0.5cc T-G-W SQ in left upper arm. Patient given 0.5cc M-D-M SQ in right upper arm.  Patient tolerated both injections well.

## 2015-07-27 ENCOUNTER — Ambulatory Visit (INDEPENDENT_AMBULATORY_CARE_PROVIDER_SITE_OTHER): Payer: BLUE CROSS/BLUE SHIELD | Admitting: Internal Medicine

## 2015-07-27 VITALS — BP 116/82

## 2015-07-27 DIAGNOSIS — J3089 Other allergic rhinitis: Secondary | ICD-10-CM

## 2015-07-27 NOTE — Progress Notes (Signed)
0.64ml of M-DM given in RUA. Pt tolerated well. 0.77ml of T-G-W given in LUA. Pt tolerated well.

## 2015-08-15 ENCOUNTER — Encounter (HOSPITAL_COMMUNITY): Payer: Self-pay | Admitting: Emergency Medicine

## 2015-08-15 ENCOUNTER — Emergency Department (HOSPITAL_COMMUNITY)
Admission: EM | Admit: 2015-08-15 | Discharge: 2015-08-15 | Disposition: A | Discharge status: Home / Self Care | Payer: BLUE CROSS/BLUE SHIELD | Source: Home / Self Care | Attending: Family Medicine | Admitting: Family Medicine

## 2015-08-15 DIAGNOSIS — J069 Acute upper respiratory infection, unspecified: Secondary | ICD-10-CM

## 2015-08-15 NOTE — ED Provider Notes (Signed)
CSN: [id number]     Arrival date & time 08/15/15  1311 History   First MD Initiated Contact with Patient 08/15/15 1357     Chief Complaint  Patient presents with  . URI   (Consider location/radiation/quality/duration/timing/severity/associated sxs/prior Treatment) Patient is a 55 y.o. female presenting with URI. The history is provided by the patient. No language interpreter was used.  URI Presenting symptoms: congestion and cough   Presenting symptoms comment:  Started having congestion 4 days ago,she had runny nose as well. Yesterday she started coughing some with low grade fever. She took mucinex. She also took ASA 325 mg about 3 hours ago which help some with her symptoms. Temp checked today was 100. Severity:  Mild Onset quality:  Gradual Duration:  4 days Timing:  Intermittent Associated symptoms: headaches and myalgias   Associated symptoms comment:  Denies sore throat Risk factors: sick contacts   Risk factors comment:  Exposure to 3 people at work sick with flu She did not get her flu shot this season.  Past Medical History  Diagnosis Date  . Allergy    Past Surgical History  Procedure Laterality Date  . Appendectomy    . Right shoulder surgery 2002 and 2005     Family History  Problem Relation Age of Onset  . Cancer Father     Prostate cancer  . Heart failure Mother    Social History  Substance Use Topics  . Smoking status: Never Smoker   . Smokeless tobacco: Never Used  . Alcohol Use: 1.8 oz/week    3 Glasses of wine per week   OB History    No data available     Review of Systems  HENT: Positive for congestion.   Respiratory: Positive for cough.   Cardiovascular: Negative.   Genitourinary: Negative.   Musculoskeletal: Positive for myalgias.  Neurological: Positive for headaches.  All other systems reviewed and are negative.   Allergies  Eggs or egg-derived products and Peanuts  Home Medications   Prior to Admission medications   Medication  Sig Start Date End Date Taking? Authorizing Provider  EPIPEN 2-PAK 0.3 MG/0.3ML DEVI  08/17/11  Yes Historical Provider, MD  FINACEA 15 % cream  01/17/12  Yes Historical Provider, MD  fluticasone Asencion Islam) 50 MCG/ACT nasal spray  08/17/11  Yes Historical Provider, MD  Multiple Vitamin (MULTIVITAMIN) capsule Take 1 capsule by mouth daily.   Yes Historical Provider, MD  NON FORMULARY Tumeric   Yes Historical Provider, MD  ofloxacin (OCUFLOX) 0.3 % ophthalmic solution Place 2 drops into the right eye 4 (four) times daily. 10/02/14  Yes Elby Showers, MD  valsartan (DIOVAN) 40 MG tablet take 1 tablet by mouth once daily 08/30/14  Yes Elby Showers, MD  VITAMIN D, CHOLECALCIFEROL, PO Take by mouth.   Yes Historical Provider, MD  azithromycin (ZITHROMAX) 250 MG tablet 2 po day 1 followed by one po days 2-5 05/21/15   Elby Showers, MD   Meds Ordered and Administered this Visit  Medications - No data to display  BP 127/85 mmHg  Pulse 85  Temp(Src) 98.9 F (37.2 C) (Oral)  Resp 18  SpO2 99%  LMP 10/11/2011 No data found.   Physical Exam  Constitutional: She appears well-developed and well-nourished. No distress.  HENT:  Head: Normocephalic.  Right Ear: External ear normal.  Left Ear: External ear normal.  Nose: Nose normal.  Mouth/Throat: Oropharynx is clear and moist. No oropharyngeal exudate.  Eyes: Conjunctivae and EOM are normal.  Pupils are equal, round, and reactive to light. Right eye exhibits no discharge. Left eye exhibits no discharge. No scleral icterus.  Neck: Normal range of motion. Neck supple. No JVD present.  Cardiovascular: Normal rate, regular rhythm, normal heart sounds and intact distal pulses.   No murmur heard. Pulmonary/Chest: Effort normal and breath sounds normal. No respiratory distress. She has no wheezes.  Lymphadenopathy:    She has no cervical adenopathy.  Nursing note and vitals reviewed.   ED Course  Procedures (including critical care time)  Labs  Review Labs Reviewed - No data to display  Imaging Review No results found.   Visual Acuity Review  Right Eye Distance:   Left Eye Distance:   Bilateral Distance:    Right Eye Near:   Left Eye Near:    Bilateral Near:         MDM  No diagnosis found. Upper respiratory infection  Patient is well appearing. She likely have simple viral infection. Patient reassured this can last between few days to 14 days. Temp not high enough to worry about influenza. I gave her strict return precaution. Ibuprofen recommended prn pain and fever. She stated she had Ibuprofen at home. F/U as needed.    Kinnie Feil, MD 08/15/15 [phone]

## 2015-08-15 NOTE — ED Notes (Signed)
Patient c/o head cold symptoms, runny nose, cough, sore throat and general fatigue x 2 days. Patient reports she has had several coworkers out with the flu. She has had fever of 100 F this morning and took 324 mg of Aspirin with relief. Patient is in NAD.

## 2015-08-15 NOTE — Discharge Instructions (Signed)

## 2015-08-17 ENCOUNTER — Ambulatory Visit (INDEPENDENT_AMBULATORY_CARE_PROVIDER_SITE_OTHER): Payer: BLUE CROSS/BLUE SHIELD | Admitting: Internal Medicine

## 2015-08-17 ENCOUNTER — Encounter: Payer: Self-pay | Admitting: Internal Medicine

## 2015-08-17 VITALS — BP 118/82

## 2015-08-17 DIAGNOSIS — J3089 Other allergic rhinitis: Secondary | ICD-10-CM | POA: Diagnosis not present

## 2015-08-17 NOTE — Progress Notes (Signed)
Patient was given 0.5cc TGW in left arm sub-Q. Patient tolerated well.  Patient was given 0.5cc MDM in right arm sub-Q. Patient tolerated well.

## 2015-08-25 ENCOUNTER — Other Ambulatory Visit: Payer: Self-pay

## 2015-08-25 MED ORDER — VALSARTAN 40 MG PO TABS: 40.0000 mg | ORAL_TABLET | Freq: Every day | ORAL | Status: DC

## 2015-09-07 ENCOUNTER — Ambulatory Visit (INDEPENDENT_AMBULATORY_CARE_PROVIDER_SITE_OTHER): Payer: BLUE CROSS/BLUE SHIELD | Admitting: Internal Medicine

## 2015-09-07 ENCOUNTER — Encounter: Payer: Self-pay | Admitting: Internal Medicine

## 2015-09-07 VITALS — BP 116/80 | HR 64 | Wt 154.0 lb

## 2015-09-07 DIAGNOSIS — J3089 Other allergic rhinitis: Secondary | ICD-10-CM

## 2015-09-07 NOTE — Progress Notes (Signed)
Patient was given 0.5cc TGW in left arm sub-Q. Patient tolerated well.  Patient was given 0.5cc MDM in right arm sub-Q. Patient tolerated well.

## 2015-09-28 ENCOUNTER — Ambulatory Visit (INDEPENDENT_AMBULATORY_CARE_PROVIDER_SITE_OTHER): Payer: BLUE CROSS/BLUE SHIELD | Admitting: Internal Medicine

## 2015-09-28 ENCOUNTER — Encounter: Payer: Self-pay | Admitting: Internal Medicine

## 2015-09-28 VITALS — BP 130/80 | HR 84 | Wt 151.0 lb

## 2015-09-28 DIAGNOSIS — J3089 Other allergic rhinitis: Secondary | ICD-10-CM

## 2015-09-28 NOTE — Progress Notes (Signed)
0.72ml TGW given SQ left arm. Patient tolerated well.  0.52ml MDM given SQ left arm. Patient tolerated well.

## 2015-10-19 ENCOUNTER — Ambulatory Visit (INDEPENDENT_AMBULATORY_CARE_PROVIDER_SITE_OTHER): Payer: BLUE CROSS/BLUE SHIELD | Admitting: Internal Medicine

## 2015-10-19 ENCOUNTER — Encounter: Payer: Self-pay | Admitting: Internal Medicine

## 2015-10-19 VITALS — BP 188/78 | HR 68 | Resp 18 | Wt 149.0 lb

## 2015-10-19 DIAGNOSIS — J3089 Other allergic rhinitis: Secondary | ICD-10-CM | POA: Diagnosis not present

## 2015-10-19 NOTE — Progress Notes (Signed)
Patient given 0.5cc MDM sub-Q in RUA. Pt tolerated well with no complications.  Patient given 0.5cc TGW sub-Q in LUA. Pt tolerated well with no complications.

## 2015-11-10 ENCOUNTER — Ambulatory Visit (INDEPENDENT_AMBULATORY_CARE_PROVIDER_SITE_OTHER): Payer: BLUE CROSS/BLUE SHIELD | Admitting: Internal Medicine

## 2015-11-10 VITALS — BP 116/70 | HR 64 | Wt 154.0 lb

## 2015-11-10 DIAGNOSIS — J3089 Other allergic rhinitis: Secondary | ICD-10-CM | POA: Diagnosis not present

## 2015-11-10 NOTE — Care Management Note (Signed)
Patient given 0.5cc MDM sub-Q in RUA. Pt tolerated well with no complications.  Patient given 0.5cc TGW sub-Q in LUA. Pt tolerated well with no complications.

## 2015-11-12 ENCOUNTER — Ambulatory Visit: Payer: BLUE CROSS/BLUE SHIELD | Admitting: Internal Medicine

## 2015-11-27 ENCOUNTER — Ambulatory Visit (INDEPENDENT_AMBULATORY_CARE_PROVIDER_SITE_OTHER): Payer: BLUE CROSS/BLUE SHIELD | Admitting: Internal Medicine

## 2015-11-27 ENCOUNTER — Encounter: Payer: Self-pay | Admitting: Internal Medicine

## 2015-11-27 VITALS — BP 124/68 | HR 74 | Wt 145.0 lb

## 2015-11-27 DIAGNOSIS — J3089 Other allergic rhinitis: Secondary | ICD-10-CM | POA: Diagnosis not present

## 2015-12-21 ENCOUNTER — Ambulatory Visit (INDEPENDENT_AMBULATORY_CARE_PROVIDER_SITE_OTHER): Payer: BLUE CROSS/BLUE SHIELD | Admitting: Internal Medicine

## 2015-12-21 ENCOUNTER — Encounter: Payer: Self-pay | Admitting: Internal Medicine

## 2015-12-21 DIAGNOSIS — Z7184 Encounter for health counseling related to travel: Secondary | ICD-10-CM

## 2015-12-21 DIAGNOSIS — Z7189 Other specified counseling: Secondary | ICD-10-CM

## 2015-12-21 DIAGNOSIS — J309 Allergic rhinitis, unspecified: Secondary | ICD-10-CM

## 2015-12-21 MED ORDER — AZITHROMYCIN 250 MG PO TABS: ORAL_TABLET | ORAL | 0 refills | Status: DC

## 2015-12-21 NOTE — Progress Notes (Signed)
   Subjective:    Patient ID: Ann Chambers, female    DOB: 1961/02/04, 55 y.o.   MRN: [id number]  HPI For allergy injections today 0.5 mL trees grass weeds and 0.5 mL M-DM injected subcutaneously today. Patient tolerated injections well with no issues.  She is going to [place] in the near future. We are going to prescribe Zithromax Z-PAK for travel.    Review of Systems     Objective:   Physical Exam  No significant local reaction in each arm after injection      Assessment & Plan:  Allergic rhinitis  Allergy injections 2  Travel advice  Plan:: Zithromax Z-PAK to pharmacy

## 2015-12-21 NOTE — Patient Instructions (Signed)
Zithromax Z-PAK given for travel to [place]. Allergy injections given in each arm today. It was a pleasure to see you today.

## 2016-01-11 ENCOUNTER — Ambulatory Visit (INDEPENDENT_AMBULATORY_CARE_PROVIDER_SITE_OTHER): Payer: BLUE CROSS/BLUE SHIELD | Admitting: Internal Medicine

## 2016-01-11 DIAGNOSIS — J309 Allergic rhinitis, unspecified: Secondary | ICD-10-CM | POA: Diagnosis not present

## 2016-01-11 NOTE — Patient Instructions (Signed)
It was a pleasure to see you today. Allergy vaccines administered as ordered by allergist.

## 2016-01-11 NOTE — Progress Notes (Signed)
Allergy injections administered 0.5 mL each arm as recommended by allergist per protocol 0.5 mL trees grass and weeds and 0.5 mL mold and dust mite.  Says she will be seeing allergist soon and will update [place] if dose changes.  Patient seen by M.D. and CMA today

## 2016-02-02 ENCOUNTER — Ambulatory Visit (INDEPENDENT_AMBULATORY_CARE_PROVIDER_SITE_OTHER): Payer: BLUE CROSS/BLUE SHIELD | Admitting: Internal Medicine

## 2016-02-02 DIAGNOSIS — J309 Allergic rhinitis, unspecified: Secondary | ICD-10-CM | POA: Diagnosis not present

## 2016-02-02 NOTE — Progress Notes (Signed)
Patient given 0.5cc MDM sub-Q in RUA. Pt tolerated well with no complications.  Patient given 0.5cc TGW sub-Q in LUA. Pt tolerated well with no complications.

## 2016-02-03 ENCOUNTER — Encounter: Payer: Self-pay | Admitting: Internal Medicine

## 2016-02-03 NOTE — Patient Instructions (Signed)
RTC Sept 25th.

## 2016-02-22 ENCOUNTER — Ambulatory Visit (INDEPENDENT_AMBULATORY_CARE_PROVIDER_SITE_OTHER): Payer: BLUE CROSS/BLUE SHIELD | Admitting: Internal Medicine

## 2016-02-22 ENCOUNTER — Encounter: Payer: Self-pay | Admitting: Internal Medicine

## 2016-02-22 VITALS — BP 132/92

## 2016-02-22 DIAGNOSIS — J309 Allergic rhinitis, unspecified: Secondary | ICD-10-CM

## 2016-02-22 DIAGNOSIS — I10 Essential (primary) hypertension: Secondary | ICD-10-CM | POA: Diagnosis not present

## 2016-02-22 NOTE — Patient Instructions (Signed)
Allergy injections given in each arm today. Physical exam booked for January 2018. Return in 2 weeks.

## 2016-02-22 NOTE — Progress Notes (Signed)
0.5cc M-DM given in RUA, no reaction. 0.5cc T-G-W given in LUA, no reaction.

## 2016-03-17 ENCOUNTER — Ambulatory Visit (INDEPENDENT_AMBULATORY_CARE_PROVIDER_SITE_OTHER): Payer: BLUE CROSS/BLUE SHIELD | Admitting: Internal Medicine

## 2016-03-17 DIAGNOSIS — J309 Allergic rhinitis, unspecified: Secondary | ICD-10-CM | POA: Diagnosis not present

## 2016-03-17 NOTE — Patient Instructions (Signed)
0.5cc M-DM given in RUA, no reaction 0.5cc T-G-W given in LUA, no reaction

## 2016-04-05 ENCOUNTER — Encounter: Payer: Self-pay | Admitting: Internal Medicine

## 2016-04-05 ENCOUNTER — Ambulatory Visit (INDEPENDENT_AMBULATORY_CARE_PROVIDER_SITE_OTHER): Payer: BLUE CROSS/BLUE SHIELD | Admitting: Internal Medicine

## 2016-04-05 VITALS — BP 130/90 | Temp 97.7°F

## 2016-04-05 DIAGNOSIS — J309 Allergic rhinitis, unspecified: Secondary | ICD-10-CM

## 2016-04-05 DIAGNOSIS — L239 Allergic contact dermatitis, unspecified cause: Secondary | ICD-10-CM

## 2016-04-05 DIAGNOSIS — I10 Essential (primary) hypertension: Secondary | ICD-10-CM | POA: Diagnosis not present

## 2016-04-05 MED ORDER — HYDROCORTISONE 2.5 % EX CREA: TOPICAL_CREAM | Freq: Three times a day (TID) | CUTANEOUS | 0 refills | Status: DC

## 2016-04-05 NOTE — Patient Instructions (Addendum)
Hytone cream 2 1/2% to neck area 3 times daily sparingly. Allergy injections given.

## 2016-04-05 NOTE — Progress Notes (Signed)
   Subjective:    Patient ID: Ann Chambers, female    DOB: Apr 07, 1961, 55 y.o.   MRN: [id number]  HPI For 4 weeks, has had redness and flaking left neck area. No known allergens. Has not really changed soaps or perfumes that she is aware of. No new cosmetics.  Blood pressure taken today with history of hypertension  2 allergy injections given in each arm. Left arm moles and dust mites. Right arm trees weeds and grasses.    Review of Systems     Objective:   Physical Exam  Small area of erythema with flaking left anterior neck      Assessment & Plan:  Contact dermatitis  Essential hypertension  Allergic rhinitis plan: Allergy injections documented above. Hytone cream 2 1/2% sparingly to left neck area 3 times daily. If not improving in a couple of weeks, see dermatologist.

## 2016-04-25 ENCOUNTER — Ambulatory Visit (INDEPENDENT_AMBULATORY_CARE_PROVIDER_SITE_OTHER): Payer: BLUE CROSS/BLUE SHIELD | Admitting: Internal Medicine

## 2016-04-25 ENCOUNTER — Encounter: Payer: Self-pay | Admitting: Internal Medicine

## 2016-04-25 VITALS — BP 140/90

## 2016-04-25 DIAGNOSIS — J3089 Other allergic rhinitis: Secondary | ICD-10-CM | POA: Diagnosis not present

## 2016-04-25 NOTE — Progress Notes (Signed)
Allergy injections given x 2 . Tolerated well. No reactions

## 2016-05-16 ENCOUNTER — Ambulatory Visit (INDEPENDENT_AMBULATORY_CARE_PROVIDER_SITE_OTHER): Payer: BLUE CROSS/BLUE SHIELD | Admitting: Internal Medicine

## 2016-05-16 DIAGNOSIS — J3089 Other allergic rhinitis: Secondary | ICD-10-CM | POA: Diagnosis not present

## 2016-05-16 NOTE — Patient Instructions (Signed)
Allergy vaccines given 

## 2016-05-16 NOTE — Progress Notes (Signed)
   Subjective:    Patient ID: Ann Chambers, female    DOB: 01/14/1961, 55 y.o.   MRN: [id number]  HPI Here today for allergy vaccines. Has been doing well with no reactions. Receives serum for trees, grasses and weeds and  molds and dust mite    Review of Systems     Objective:   Physical Exam  Not examined    Assessment & Plan:

## 2016-06-02 ENCOUNTER — Other Ambulatory Visit (INDEPENDENT_AMBULATORY_CARE_PROVIDER_SITE_OTHER): Payer: BLUE CROSS/BLUE SHIELD | Admitting: Internal Medicine

## 2016-06-02 DIAGNOSIS — Z130 Encounter for screening for diseases of the blood and blood-forming organs and certain disorders involving the immune mechanism: Secondary | ICD-10-CM | POA: Diagnosis not present

## 2016-06-02 DIAGNOSIS — R5383 Other fatigue: Secondary | ICD-10-CM

## 2016-06-02 DIAGNOSIS — I10 Essential (primary) hypertension: Secondary | ICD-10-CM

## 2016-06-02 DIAGNOSIS — E7800 Pure hypercholesterolemia, unspecified: Secondary | ICD-10-CM

## 2016-06-02 LAB — CBC WITH DIFFERENTIAL/PLATELET
BASOS PCT: 1 %
Basophils Absolute: 55 cells/uL (ref 0–200)
EOS ABS: 275 {cells}/uL (ref 15–500)
EOS PCT: 5 %
HCT: 38.2 % (ref 35.0–45.0)
HEMOGLOBIN: 12.5 g/dL (ref 11.7–15.5)
LYMPHS ABS: 1485 {cells}/uL (ref 850–3900)
Lymphocytes Relative: 27 %
MCH: 30.3 pg (ref 27.0–33.0)
MCHC: 32.7 g/dL (ref 32.0–36.0)
MCV: 92.5 fL (ref 80.0–100.0)
MONOS PCT: 8 %
MPV: 9.2 fL (ref 7.5–12.5)
Monocytes Absolute: 440 cells/uL (ref 200–950)
NEUTROS ABS: 3245 {cells}/uL (ref 1500–7800)
Neutrophils Relative %: 59 %
PLATELETS: 318 10*3/uL (ref 140–400)
RBC: 4.13 MIL/uL (ref 3.80–5.10)
RDW: 13.4 % (ref 11.0–15.0)
WBC: 5.5 10*3/uL (ref 3.8–10.8)

## 2016-06-02 LAB — COMPREHENSIVE METABOLIC PANEL
ALBUMIN: 4.3 g/dL (ref 3.6–5.1)
ALK PHOS: 64 U/L (ref 33–130)
ALT: 19 U/L (ref 6–29)
AST: 21 U/L (ref 10–35)
BILIRUBIN TOTAL: 0.5 mg/dL (ref 0.2–1.2)
BUN: 18 mg/dL (ref 7–25)
CO2: 23 mmol/L (ref 20–31)
CREATININE: 0.7 mg/dL (ref 0.50–1.05)
Calcium: 9.2 mg/dL (ref 8.6–10.4)
Chloride: 104 mmol/L (ref 98–110)
Glucose, Bld: 76 mg/dL (ref 65–99)
Potassium: 4.5 mmol/L (ref 3.5–5.3)
SODIUM: 140 mmol/L (ref 135–146)
TOTAL PROTEIN: 6.6 g/dL (ref 6.1–8.1)

## 2016-06-02 LAB — LIPID PANEL
CHOLESTEROL: 192 mg/dL (ref <200)
HDL: 111 mg/dL (ref >50)
LDL Cholesterol: 71 mg/dL (ref <100)
Total CHOL/HDL Ratio: 1.7 Ratio (ref <5.0)
Triglycerides: 48 mg/dL (ref <150)
VLDL: 10 mg/dL (ref <30)

## 2016-06-02 LAB — TSH: TSH: 1.43 mIU/L

## 2016-06-03 ENCOUNTER — Encounter: Payer: Self-pay | Admitting: Internal Medicine

## 2016-06-03 ENCOUNTER — Ambulatory Visit (INDEPENDENT_AMBULATORY_CARE_PROVIDER_SITE_OTHER): Payer: BLUE CROSS/BLUE SHIELD | Admitting: Internal Medicine

## 2016-06-03 VITALS — BP 116/78 | HR 78 | Temp 97.7°F | Ht 64.0 in | Wt 158.0 lb

## 2016-06-03 DIAGNOSIS — Z9181 History of falling: Secondary | ICD-10-CM | POA: Diagnosis not present

## 2016-06-03 DIAGNOSIS — Z0000 Encounter for general adult medical examination without abnormal findings: Secondary | ICD-10-CM | POA: Diagnosis not present

## 2016-06-03 DIAGNOSIS — Z8669 Personal history of other diseases of the nervous system and sense organs: Secondary | ICD-10-CM | POA: Diagnosis not present

## 2016-06-03 DIAGNOSIS — Z91012 Allergy to eggs: Secondary | ICD-10-CM | POA: Diagnosis not present

## 2016-06-03 DIAGNOSIS — I10 Essential (primary) hypertension: Secondary | ICD-10-CM

## 2016-06-03 DIAGNOSIS — J309 Allergic rhinitis, unspecified: Secondary | ICD-10-CM

## 2016-06-03 LAB — POCT URINALYSIS DIPSTICK
Bilirubin, UA: NEGATIVE
GLUCOSE UA: NEGATIVE
KETONES UA: NEGATIVE
Leukocytes, UA: NEGATIVE
Nitrite, UA: NEGATIVE
Protein, UA: NEGATIVE
RBC UA: NEGATIVE
SPEC GRAV UA: 1.01
UROBILINOGEN UA: NEGATIVE
pH, UA: 6

## 2016-06-03 NOTE — Patient Instructions (Signed)
May apply ice or heat to right thigh area status post fall. Allergy injections given today. Continue same medications. It was a pleasure to see you today. Return for blood pressure check in 6 months otherwise allergy injections every 2 weeks

## 2016-06-03 NOTE — Progress Notes (Signed)
Subjective:    Patient ID: Ann Chambers, female    DOB: March 22, 1961, 56 y.o.   MRN: [id number]  HPI 56 year old Female in today for health maintenance exam and evaluation of medical issues.   Late yesterday she was watering a plant at work and took a tumble in her office. She injured her right upper thigh area. There is no obvious contusion but she did have some swelling. She applied ice.  She has a history of allergic rhinitis. She takes allergy injections supplied by [facility] approximately every 2 weeks through this office. These were administered today. Has allergies to trees, grasses, and weeds, dust and dust mite.  History of essential hypertension and migraine headaches. History of scoliosis. She receives massage therapy for issues with back pain.  In June 2016 she saw Dr. Tonia Brooms for annual skin checkup. There was an area in the right lateral lower thigh which was thought to be a lipoma which was removed. Indeed pathology showed benign adipose tissue with delicate vascular spaces and thin septae. No atypia was observed.  Lab work reviewed with her and is entirely within normal limits including lipid panel.  She has  egg allergy does not take flu vaccine.  No known drug allergies.  Sees GYN for GYN exam.  In 2015 had some chest pain and EKG was normal.  Appendectomy at [place].  Right shoulder surgery October 2005 and another right shoulder surgery in 2002.  Patient was seen in emergency department with acute abdominal pain December 1997. CT showed thickening of the midportion of the Ace and ending:. She proceeded with colonoscopy in 1998 which was normal. Had another colonoscopy in 2011 which was normal. History of lactose intolerance and irritable bowel syndrome.  Family history: Father with history of hypertension, MI, CABG died with cerebral hemorrhage. Mother with history of cardiomyopathy and congestive heart failure died at 80 years of age. One  brother in good health. One sister in good health. Both parents with history of hypertension. Sister with hypertension.  Social history: Married with no children. Employed as a Academic librarian. Nonsmoker. Social alcohol consumption.    Review of Systems  Constitutional: Negative.   All other systems reviewed and are negative.      Objective:   Physical Exam  Constitutional: She is oriented to person, place, and time. She appears well-developed and well-nourished. No distress.  HENT:  Head: Normocephalic and atraumatic.  Right Ear: External ear normal.  Left Ear: External ear normal.  Eyes: Conjunctivae are normal. Left eye exhibits no discharge. No scleral icterus.  Neck: Neck supple. No JVD present. No thyromegaly present.  Cardiovascular: Normal rate, regular rhythm and normal heart sounds.   No murmur heard. Pulmonary/Chest: Effort normal and breath sounds normal. No respiratory distress. She has no wheezes. She has no rales.  Abdominal: Soft. Bowel sounds are normal. She exhibits no distension and no mass. There is no tenderness. There is no rebound and no guarding.  Genitourinary:  Genitourinary Comments: Deferred to GYN  Musculoskeletal: She exhibits no edema.  Lymphadenopathy:    She has no cervical adenopathy.  Neurological: She is alert and oriented to person, place, and time. She has normal reflexes. No cranial nerve deficit. Coordination normal.  Skin: Skin is warm and dry. No rash noted. She is not diaphoretic.  Psychiatric: She has a normal mood and affect. Her behavior is normal. Judgment and thought content normal.  Vitals reviewed.  Assessment & Plan:  Health maintenance exam  Essential hypertension-Stable on current regimen  Musculoskeletal pain right thigh- secondary to fall  History of migraine headaches  Allergic rhinitis-on immunotherapy  Plan: Return in 6 months for office visit and blood pressure check and continue  immunotherapy approximately every 2 weeks.

## 2016-06-24 ENCOUNTER — Encounter: Payer: Self-pay | Admitting: Internal Medicine

## 2016-06-24 ENCOUNTER — Ambulatory Visit (INDEPENDENT_AMBULATORY_CARE_PROVIDER_SITE_OTHER): Payer: BLUE CROSS/BLUE SHIELD | Admitting: Internal Medicine

## 2016-06-24 VITALS — BP 120/80 | HR 66 | Temp 98.4°F

## 2016-06-24 DIAGNOSIS — I10 Essential (primary) hypertension: Secondary | ICD-10-CM | POA: Diagnosis not present

## 2016-06-24 DIAGNOSIS — J3089 Other allergic rhinitis: Secondary | ICD-10-CM

## 2016-06-24 NOTE — Patient Instructions (Signed)
RTC in 2 weeks

## 2016-06-24 NOTE — Progress Notes (Signed)
   Subjective:    Patient ID: Ann Chambers, female    DOB: 05/02/1961, 56 y.o.   MRN: [id number]  HPI Allergy injections given left and right arms with no reaction    Review of Systems     Objective:   Physical Exam  Not examined      Assessment & Plan:  Allergic rhinitis

## 2016-07-12 ENCOUNTER — Ambulatory Visit (INDEPENDENT_AMBULATORY_CARE_PROVIDER_SITE_OTHER): Payer: BLUE CROSS/BLUE SHIELD | Admitting: Internal Medicine

## 2016-07-12 DIAGNOSIS — J3089 Other allergic rhinitis: Secondary | ICD-10-CM | POA: Diagnosis not present

## 2016-07-12 NOTE — Progress Notes (Signed)
Allergy injections x 2 one in each arm as directed by allergist. TGW left arm and M-DM right arm

## 2016-07-12 NOTE — Patient Instructions (Addendum)
Gave .5cc of TGW on left arm ID Gave .5cc of MDM on Right arm ID  No reactions noted.    RTC in 2 weeks 

## 2016-07-14 ENCOUNTER — Ambulatory Visit: Payer: BLUE CROSS/BLUE SHIELD | Admitting: Internal Medicine

## 2016-08-01 ENCOUNTER — Ambulatory Visit (INDEPENDENT_AMBULATORY_CARE_PROVIDER_SITE_OTHER): Payer: BLUE CROSS/BLUE SHIELD | Admitting: Internal Medicine

## 2016-08-01 VITALS — BP 108/80 | HR 72 | Temp 97.7°F

## 2016-08-01 DIAGNOSIS — R079 Chest pain, unspecified: Secondary | ICD-10-CM | POA: Diagnosis not present

## 2016-08-01 DIAGNOSIS — J3089 Other allergic rhinitis: Secondary | ICD-10-CM | POA: Diagnosis not present

## 2016-08-01 DIAGNOSIS — I10 Essential (primary) hypertension: Secondary | ICD-10-CM

## 2016-08-01 NOTE — Progress Notes (Signed)
   Subjective:    Patient ID: Ann Chambers, female    DOB: [birth year], 56 y.o.   MRN: [id number]  HPI 56 year old White Female in today with history of essential hypertension treated with Diovan 40 mg daily.   Her initial appointment was for allergy injections only. She gets these every 2 weeks generally.   However she mentioned she had been having some chest pain recently. We decided she needed evaluation today.  Patient says she's been working a lot of hours. Has had some chest pain that awakened her from sleep. It has no radiation to neck or down her left arm and not associated with nausea vomiting or diaphoresis. She doesn't think it's reflux symptoms.  She has Family history of coronary artery disease.Father had history of hypertension, MI, CABG died with cerebral hemorrhage. Mother with history of cardiomyopathy and congestive heart failure died at 30 years of age. One sister with hypertension. One brother in good health. Both parents had hypertension.  Social history: She works as a Chief Technology Officer. Has been working a lot of hours recently. Nonsmoker. Social alcohol consumption.   Has been working out recently and thought maybe chest pain was related to musculoskeletal pain.    Review of Systems see above-denies water brash     Objective:   Physical Exam Skin warm and dry. Nodes none. Neck is supple. No carotid bruits. Chest clear to auscultation. Cardiac exam regular rate and rhythm normal S1 and S2 without murmurs or gallops. Extremities without edema.  EKG is within normal limits       Assessment & Plan:  Chest pain-etiology unclear. It awakens her from sleep. She has family history of coronary disease and were going to refer her to cardiology for further evaluation  Essential hypertension-patient to monitor blood pressure at home and call if persistently elevated  Allergic rhinitis-allergy injections 2 given today. Return in 2 weeks for another set of allergy  injections.

## 2016-08-01 NOTE — Patient Instructions (Addendum)
Gave .5cc of TGW on left arm ID Gave .5cc of MDM on Right arm ID  No reactions noted.    RTC in 2 weeks  Cardiology referral made.

## 2016-08-12 ENCOUNTER — Other Ambulatory Visit: Payer: Self-pay

## 2016-08-12 MED ORDER — VALSARTAN 40 MG PO TABS
40.0000 mg | ORAL_TABLET | Freq: Every day | ORAL | 10 refills | Status: DC
Start: 2016-08-12 — End: 2016-08-12

## 2016-08-12 MED ORDER — VALSARTAN 40 MG PO TABS: 40.0000 mg | ORAL_TABLET | Freq: Every day | ORAL | 10 refills | Status: DC

## 2016-08-16 ENCOUNTER — Encounter: Payer: Self-pay | Admitting: Internal Medicine

## 2016-08-19 ENCOUNTER — Encounter: Payer: Self-pay | Admitting: Physician Assistant

## 2016-08-22 ENCOUNTER — Ambulatory Visit (INDEPENDENT_AMBULATORY_CARE_PROVIDER_SITE_OTHER): Payer: BLUE CROSS/BLUE SHIELD | Admitting: Internal Medicine

## 2016-08-22 VITALS — BP 108/80 | HR 64 | Temp 98.2°F

## 2016-08-22 DIAGNOSIS — J309 Allergic rhinitis, unspecified: Secondary | ICD-10-CM | POA: Diagnosis not present

## 2016-08-22 DIAGNOSIS — I10 Essential (primary) hypertension: Secondary | ICD-10-CM | POA: Diagnosis not present

## 2016-08-22 NOTE — Patient Instructions (Signed)
Gave .5cc of TGW on left arm ID Gave .5cc of MDM on Right arm ID  No reactions noted.    RTC in 2 weeks

## 2016-08-22 NOTE — Progress Notes (Signed)
   Subjective:    Patient ID: Ann Chambers, female    DOB: Jan 14, 1961, 56 y.o.   MRN: [id number]  HPI Allergy injections given by CMA    Review of Systems     Objective:   Physical Exam        Assessment & Plan:

## 2016-08-23 ENCOUNTER — Ambulatory Visit: Payer: BLUE CROSS/BLUE SHIELD | Admitting: Internal Medicine

## 2016-08-31 ENCOUNTER — Encounter: Payer: Self-pay | Admitting: Physician Assistant

## 2016-08-31 NOTE — Progress Notes (Signed)
Cardiology Office Note    Date:  09/01/2016  ID:  Ann Chambers, DOB 09/25/1960, MRN [id number] PCP:  Elby Showers, MD  Cardiologist:  New, reviewed with Dr. Radford Pax   Chief Complaint: chest pain  History of Present Illness:  Ann Chambers is a 56 y.o. female with history of allergic rhinitis, HTN, migraines, scoliosis who presents for new patient evaluation of chest pain. 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 Over the last 3 months had about 6-8 weeks of episodes of waking up at night with sensation of chest pressure. It would last 30-40 minutes and resolve spontaneously because she would fall asleep. She did note some awareness of her heart beating harder but she's not sure if this was just because she became hypervigilant while it was happening. Symptoms were not made worse by anything or better by anything. No SOB, LEE, orthopnea, syncope or dizziness. She has not had any symptoms in the last week. She was seen by her PCP who recommended she f/u here. The patient was under the impression she was here for a stress test today but I do not see this scheduled. She is quite active and does pilates, barre, and bowflex tread climber without any cardiac symptoms or functional limitation. She feels well today. Last labs 05/2016 showed normal TSH, CMET, CBC, lipids (LDL 71).  Cardiac risk factors include: 1) mild HTN, noted after rx for poison ivy with prednisone. 2) Family history of CAD - father had CABG in his 39s, possible heart issues in his 31s, multiple family members on that side with heart attacks.   Past Medical History:  Diagnosis Date  . Allergic rhinitis   . Egg allergy   . Essential hypertension   . Migraines    Resolved after menopause  . Scoliosis     Past Surgical History:  Procedure Laterality Date  . APPENDECTOMY    . right shoulder surgery 2002 and 2005      Current Medications: Current Outpatient Prescriptions  Medication Sig  Dispense Refill  . EPIPEN 2-PAK 0.3 MG/0.3ML DEVI     . fluticasone (FLONASE) 50 MCG/ACT nasal spray Place 5 sprays into both nostrils as needed for allergies or rhinitis. Pt only takes during the spring    . Multiple Vitamin (MULTIVITAMIN) capsule Take 1 capsule by mouth daily.    Marland Kitchen VITAMIN D, CHOLECALCIFEROL, PO Take by mouth.     No current facility-administered medications for this visit.      Allergies:   Eggs or egg-derived products and Peanuts [peanut oil]   Social History   Social History  . Marital status: Married    Spouse name: N/A  . Number of children: N/A  . Years of education: N/A   Social History Main Topics  . Smoking status: Never Smoker  . Smokeless tobacco: Never Used  . Alcohol use 1.8 oz/week    3 Glasses of wine per week     Comment: Glass of wine 3-5 nights a week  . Drug use: No  . Sexual activity: Yes    Birth control/ protection: Post-menopausal   Other Topics Concern  . None   Social History Narrative  . None     Family History:  Family History  Problem Relation Age of Onset  . Cancer Father     Prostate cancer  . CAD Father     h/o bypass in his 77s, possible heart issues in his 54s  . Hypertension Father   .  Heart failure Mother     Rheumatic fever, nonischemic  . Hypertension Mother   . Rheumatic fever Mother   . Hypertension Sister      ROS:   Please see the history of present illness.  All other systems are reviewed and otherwise negative.    PHYSICAL EXAM:   VS:  BP 112/82   Pulse 63   Ht 5\' 5"  (1.651 m)   Wt 158 lb 12.8 oz (72 kg)   LMP 10/11/2011   SpO2 97%   BMI 26.43 kg/m   BMI: Body mass index is 26.43 kg/m. GEN: Well nourished, well developed WF, in no acute distress  HEENT: normocephalic, atraumatic Neck: no JVD, carotid bruits, or masses Cardiac: RRR; no murmurs, rubs, or gallops, no edema  Respiratory:  clear to auscultation bilaterally, normal work of breathing GI: soft, nontender, nondistended, +  BS MS: no deformity or atrophy  Skin: warm and dry, no rash Neuro:  Alert and Oriented x 3, Strength and sensation are intact, follows commands Psych: euthymic mood, full affect  Wt Readings from Last 3 Encounters:  09/01/16 158 lb 12.8 oz (72 kg)  06/03/16 158 lb (71.7 kg)  11/27/15 145 lb (65.8 kg)      Studies/Labs Reviewed:   EKG:  EKG was ordered today and personally reviewed by me and demonstrates NSR 63bpm, no acute changes  Recent Labs: 06/02/2016: ALT 19; BUN 18; Creat 0.70; Hemoglobin 12.5; Platelets 318; Potassium 4.5; Sodium 140; TSH 1.43   Lipid Panel    Component Value Date/Time   CHOL 192 06/02/2016 1030   TRIG 48 06/02/2016 1030   HDL 111 06/02/2016 1030   CHOLHDL 1.7 06/02/2016 1030   VLDL 10 06/02/2016 1030   LDLCALC 71 06/02/2016 1030    Additional studies/ records that were reviewed today include: Summarized above.    ASSESSMENT & PLAN:   1. Chest pain - reviewed with Dr. Radford Pax since this was a new patient to our clinic. Symptoms grossly atypical, not associated with activity. Given family history of CAD and mild HTN, we would recommend a coronary calcium score and an ETT. If these are unremarkable, no further cardiac workup is warranted. Continue surveillance for more typical symptoms. 2. HTN - controlled on low dose Diovan. 3. Family history of CAD - see above re: risk stratification. 4. H/o migraines - resolved following menopause. No hx of stroke.  Disposition: F/u with me after above testing.  Medication Adjustments/Labs and Tests Ordered: Current medicines are reviewed at length with the patient today.  Concerns regarding medicines are outlined above. Medication changes, Labs and Tests ordered today are summarized above and listed in the Patient Instructions accessible in Encounters.   Raechel Ache PA-C  09/01/2016 10:01 AM    [facility] [address], [place], [place]  [address] Phone: [phone]; Fax: [phone]

## 2016-09-01 ENCOUNTER — Ambulatory Visit (INDEPENDENT_AMBULATORY_CARE_PROVIDER_SITE_OTHER): Payer: BLUE CROSS/BLUE SHIELD | Admitting: Physician Assistant

## 2016-09-01 ENCOUNTER — Ambulatory Visit (INDEPENDENT_AMBULATORY_CARE_PROVIDER_SITE_OTHER): Payer: BLUE CROSS/BLUE SHIELD

## 2016-09-01 ENCOUNTER — Ambulatory Visit (INDEPENDENT_AMBULATORY_CARE_PROVIDER_SITE_OTHER)
Admission: RE | Admit: 2016-09-01 | Discharge: 2016-09-01 | Disposition: A | Discharge status: Home / Self Care | Payer: Self-pay | Source: Ambulatory Visit | Attending: Physician Assistant | Admitting: Physician Assistant

## 2016-09-01 ENCOUNTER — Encounter: Payer: Self-pay | Admitting: Physician Assistant

## 2016-09-01 VITALS — BP 112/82 | HR 63 | Ht 65.0 in | Wt 158.8 lb

## 2016-09-01 DIAGNOSIS — I10 Essential (primary) hypertension: Secondary | ICD-10-CM | POA: Diagnosis not present

## 2016-09-01 DIAGNOSIS — R079 Chest pain, unspecified: Secondary | ICD-10-CM

## 2016-09-01 DIAGNOSIS — Z8249 Family history of ischemic heart disease and other diseases of the circulatory system: Secondary | ICD-10-CM | POA: Diagnosis not present

## 2016-09-01 DIAGNOSIS — G43809 Other migraine, not intractable, without status migrainosus: Secondary | ICD-10-CM | POA: Diagnosis not present

## 2016-09-01 NOTE — Patient Instructions (Addendum)
Medication Instructions:  Your physician recommends that you continue on your current medications as directed. Please refer to the Current Medication list given to you today.   Labwork: None ordered  Testing/Procedures: Your physician has requested that you have an exercise tolerance test. For further information please visit [email or web address]. Please also follow instruction sheet, as given.  Your physician has requested that you have an Calcium Scoring Teste done.    Follow-Up: Your physician recommends that you schedule a follow-up appointment in: [place], PA-C   Any Other Special Instructions Will Be Listed Below (If Applicable).    Exercise Stress Electrocardiogram An exercise stress electrocardiogram is a test that is done to evaluate the blood supply to your heart. This test may also be called exercise stress electrocardiography. The test is done while you are walking on a treadmill. The goal of this test is to raise your heart rate. This test is done to find areas of poor blood flow to the heart by determining the extent of coronary artery disease (CAD). CAD is defined as narrowing in one or more heart (coronary) arteries of more than 70%. If you have an abnormal test result, this may mean that you are not getting adequate blood flow to your heart during exercise. Additional testing may be needed to understand why your test was abnormal. Tell a health care provider about:  Any allergies you have.  All medicines you are taking, including vitamins, herbs, eye drops, creams, and over-the-counter medicines.  Any problems you or family members have had with anesthetic medicines.  Any blood disorders you have.  Any surgeries you have had.  Any medical conditions you have.  Possibility of pregnancy, if this applies. What are the risks? Generally, this is a safe procedure. However, as with any procedure, complications can occur. Possible  complications can include:  Pain or pressure in the following areas:  Chest.  Jaw or neck.  Between your shoulder blades.  Radiating down your left arm.  Dizziness or light-headedness.  Shortness of breath.  Increased or irregular heartbeats.  Nausea or vomiting.  Heart attack (rare). What happens before the procedure?  Avoid all forms of caffeine 24 hours before your test or as directed by your health care provider. This includes coffee, tea (even decaffeinated tea), caffeinated sodas, chocolate, cocoa, and certain pain medicines.  Follow your health care provider's instructions regarding eating and drinking before the test.  Take your medicines as directed at regular times with water unless instructed otherwise. Exceptions may include:  If you have diabetes, ask how you are to take your insulin or pills. It is common to adjust insulin dosing the morning of the test.  If you are taking beta-blocker medicines, it is important to talk to your health care provider about these medicines well before the date of your test. Taking beta-blocker medicines may interfere with the test. In some cases, these medicines need to be changed or stopped 24 hours or more before the test.  If you wear a nitroglycerin patch, it may need to be removed prior to the test. Ask your health care provider if the patch should be removed before the test.  If you use an inhaler for any breathing condition, bring it with you to the test.  If you are an outpatient, bring a snack so you can eat right after the stress phase of the test.  Do not smoke for 4 hours prior to the test or as directed by your  health care provider.  Do not apply lotions, powders, creams, or oils on your chest prior to the test.  Wear loose-fitting clothes and comfortable shoes for the test. This test involves walking on a treadmill. What happens during the procedure?  Multiple patches (electrodes) will be put on your chest. If  needed, small areas of your chest may have to be shaved to get better contact with the electrodes. Once the electrodes are attached to your body, multiple wires will be attached to the electrodes and your heart rate will be monitored.  Your heart will be monitored both at rest and while exercising.  You will walk on a treadmill. The treadmill will be started at a slow pace. The treadmill speed and incline will gradually be increased to raise your heart rate. What happens after the procedure?  Your heart rate and blood pressure will be monitored after the test.  You may return to your normal schedule including diet, activities, and medicines, unless your health care provider tells you otherwise. This information is not intended to replace advice given to you by your health care provider. Make sure you discuss any questions you have with your health care provider. Document Released: 05/13/2000 Document Revised: 10/22/2015 Document Reviewed: 01/21/2013 Elsevier Interactive Patient Education  2017 [employer].   If you need a refill on your cardiac medications before your next appointment, please call your pharmacy.

## 2016-09-02 ENCOUNTER — Telehealth: Payer: Self-pay | Admitting: Physician Assistant

## 2016-09-02 LAB — EXERCISE TOLERANCE TEST
CHL CUP MPHR: 163 {beats}/min
CSEPED: 11 min
CSEPEDS: 0 s
CSEPHR: 92 %
Estimated workload: 13.4 METS
Peak HR: 150 {beats}/min
RPE: 17
Rest HR: 71 {beats}/min

## 2016-09-02 NOTE — Telephone Encounter (Signed)
Ann Chambers is returning your call about her stress test results . Please call  Thanks

## 2016-09-09 ENCOUNTER — Ambulatory Visit
Admission: RE | Admit: 2016-09-09 | Discharge: 2016-09-09 | Disposition: A | Discharge status: Home / Self Care | Payer: BLUE CROSS/BLUE SHIELD | Source: Ambulatory Visit | Attending: Internal Medicine | Admitting: Internal Medicine

## 2016-09-09 ENCOUNTER — Encounter: Payer: Self-pay | Admitting: Internal Medicine

## 2016-09-09 ENCOUNTER — Ambulatory Visit (INDEPENDENT_AMBULATORY_CARE_PROVIDER_SITE_OTHER): Payer: BLUE CROSS/BLUE SHIELD | Admitting: Internal Medicine

## 2016-09-09 VITALS — BP 132/98 | HR 77 | Temp 100.2°F | Wt 156.0 lb

## 2016-09-09 DIAGNOSIS — R1032 Left lower quadrant pain: Secondary | ICD-10-CM | POA: Diagnosis not present

## 2016-09-09 DIAGNOSIS — R8290 Unspecified abnormal findings in urine: Secondary | ICD-10-CM

## 2016-09-09 LAB — CBC WITH DIFFERENTIAL/PLATELET
BASOS ABS: 73 {cells}/uL (ref 0–200)
BASOS PCT: 1 %
EOS PCT: 3 %
Eosinophils Absolute: 219 cells/uL (ref 15–500)
HCT: 39.5 % (ref 35.0–45.0)
HEMOGLOBIN: 13 g/dL (ref 11.7–15.5)
LYMPHS ABS: 2190 {cells}/uL (ref 850–3900)
Lymphocytes Relative: 30 %
MCH: 30.3 pg (ref 27.0–33.0)
MCHC: 32.9 g/dL (ref 32.0–36.0)
MCV: 92.1 fL (ref 80.0–100.0)
MPV: 9 fL (ref 7.5–12.5)
Monocytes Absolute: 511 cells/uL (ref 200–950)
Monocytes Relative: 7 %
NEUTROS ABS: 4307 {cells}/uL (ref 1500–7800)
Neutrophils Relative %: 59 %
Platelets: 295 10*3/uL (ref 140–400)
RBC: 4.29 MIL/uL (ref 3.80–5.10)
RDW: 13 % (ref 11.0–15.0)
WBC: 7.3 10*3/uL (ref 3.8–10.8)

## 2016-09-09 LAB — POCT URINALYSIS DIPSTICK
Bilirubin, UA: NEGATIVE
Blood, UA: NEGATIVE
Glucose, UA: NEGATIVE
Ketones, UA: NEGATIVE
NITRITE UA: NEGATIVE
PH UA: 6.5 (ref 5.0–8.0)
Protein, UA: NEGATIVE
Spec Grav, UA: 1.01 (ref 1.010–1.025)
UROBILINOGEN UA: 0.2 U/dL

## 2016-09-09 MED ORDER — CIPROFLOXACIN HCL 500 MG PO TABS: 500.0000 mg | ORAL_TABLET | Freq: Two times a day (BID) | ORAL | 0 refills | Status: DC

## 2016-09-09 MED ORDER — METRONIDAZOLE 500 MG PO TABS: 500.0000 mg | ORAL_TABLET | Freq: Three times a day (TID) | ORAL | 0 refills | Status: DC

## 2016-09-09 MED ORDER — IOPAMIDOL (ISOVUE-300) INJECTION 61%
100.0000 mL | Freq: Once | INTRAVENOUS | Status: AC | PRN
  Administered 2016-09-09: 100 mL via INTRAVENOUS

## 2016-09-09 NOTE — Patient Instructions (Signed)
Proceed with CT of the abdomen and pelvis with and without contrast.

## 2016-09-09 NOTE — Addendum Note (Signed)
Addended by: Inocencio Homes on: 09/09/2016 03:06 PM   Modules accepted: Orders

## 2016-09-09 NOTE — Progress Notes (Deleted)
   Subjective:    Patient ID: Ann Chambers, female    DOB: 12-27-1960, 56 y.o.   MRN: [id number]  HPI  Pt. here with 4 day history    Review of Systems     Objective:   Physical Exam        Assessment & Plan:

## 2016-09-09 NOTE — Progress Notes (Signed)
   Subjective:    Patient ID: Ann Chambers, female    DOB: Aug 12, 1960, 56 y.o.   MRN: [id number]  HPI 4 day history of left lower quadrant abdominal pain. Went to the movies on April 7 and ate small amount of popcorn. Pain began either Monday or Tuesday, April 9 or 10. Was a little bit constipated just for a day. Subsequently a couple of days ago had diarrhea for 3 hours and one episode yesterday. Has low-grade fever here in office. No shaking chills. No nausea or vomiting.  Appendectomy at [place]. Has had colonoscopy in 2011. History of lactose intolerance and irritable bowel syndrome.  In 1997 she had acute abdominal pain and CT showed thickening in the midportion of the sigmoid colon and she subsequently had normal colonoscopy in 1998.  No UTI symptoms.  Says she feels that her left lower abdomen is somewhat bloated.  Recently had stress test which was within normal limits. There were 2 small 4 mm pulmonary nodules that appear to be benign. She is at low risk for malignancy. Do not see the need to repeat the CT in one year because of that. She is not a smoker. She also had a mildly dilated thoracic aorta. She is to follow-up with cardiologist in one year. This was explained to her in detail today. She had questions which were answered.  She has a history of allergic rhinitis and hypertension. She receives allergy injections through this office.  No family history of thoracic aortic aneurysm.     Review of Systems see above     Objective:   Physical Exam Abdomen is soft nondistended without hepatosplenomegaly or masses. She is tender in the left lower quadrant with plus minus rebound tenderness. CBC with differential is pending. Dipstick UA shows trace LE. Chest clear to auscultation. Cardiac exam regular rate and rhythm.       Assessment & Plan:  Left lower quadrant abdominal pain-consider sigmoid diverticulitis  Plan: Proceed with CT of abdomen with  and without contrast. CBC with differential pending.

## 2016-09-10 LAB — URINALYSIS, MICROSCOPIC ONLY
Bacteria, UA: NONE SEEN [HPF]
CASTS: NONE SEEN [LPF]
CRYSTALS: NONE SEEN [HPF]
RBC / HPF: NONE SEEN RBC/HPF (ref <=2)
Squamous Epithelial / LPF: NONE SEEN [HPF] (ref <=5)
WBC, UA: NONE SEEN WBC/HPF (ref <=5)
YEAST: NONE SEEN [HPF]

## 2016-09-11 LAB — URINE CULTURE: Organism ID, Bacteria: NO GROWTH

## 2016-09-13 ENCOUNTER — Ambulatory Visit (INDEPENDENT_AMBULATORY_CARE_PROVIDER_SITE_OTHER): Payer: BLUE CROSS/BLUE SHIELD | Admitting: Internal Medicine

## 2016-09-13 ENCOUNTER — Ambulatory Visit: Payer: BLUE CROSS/BLUE SHIELD | Admitting: Physician Assistant

## 2016-09-13 ENCOUNTER — Encounter: Payer: Self-pay | Admitting: Internal Medicine

## 2016-09-13 VITALS — BP 110/84 | HR 66 | Temp 97.8°F | Ht 65.0 in | Wt 156.0 lb

## 2016-09-13 DIAGNOSIS — L239 Allergic contact dermatitis, unspecified cause: Secondary | ICD-10-CM | POA: Diagnosis not present

## 2016-09-13 DIAGNOSIS — K5732 Diverticulitis of large intestine without perforation or abscess without bleeding: Secondary | ICD-10-CM

## 2016-09-13 DIAGNOSIS — I10 Essential (primary) hypertension: Secondary | ICD-10-CM | POA: Diagnosis not present

## 2016-09-13 NOTE — Patient Instructions (Addendum)
Advance to soft diet and return in one week. Allergy injections given today. Continue antibiotics. Blood pressure improved.

## 2016-09-13 NOTE — Progress Notes (Signed)
   Subjective:    Patient ID: Ann Chambers, female    DOB: 07-13-1960, 56 y.o.   MRN: [id number]  HPI 56 year old Female recently diagnosed with sigmoid diverticulitis treated with Cipro and Flagyl. Here today for follow-up. Says she has not had appetite until today. Ate some saltine crackers. Much less tenderness. No nausea vomiting fever or shaking chills. However, antibiotics have caused diarrhea.    Review of Systems see above     Objective:   Physical Exam Abdomen is soft nondistended without hepatosplenomegaly, masses, or significant tenderness at all       Assessment & Plan:  Acute diverticulitis-improved  Essential hypertension-stable  Plan: Advance to soft diet and return in one week.  Allergy injections given today at patient request.

## 2016-09-15 ENCOUNTER — Ambulatory Visit: Payer: BLUE CROSS/BLUE SHIELD | Admitting: Internal Medicine

## 2016-09-22 ENCOUNTER — Ambulatory Visit (INDEPENDENT_AMBULATORY_CARE_PROVIDER_SITE_OTHER): Payer: BLUE CROSS/BLUE SHIELD | Admitting: Internal Medicine

## 2016-09-22 VITALS — BP 130/80 | Temp 98.3°F | Wt 152.0 lb

## 2016-09-22 DIAGNOSIS — K5732 Diverticulitis of large intestine without perforation or abscess without bleeding: Secondary | ICD-10-CM | POA: Diagnosis not present

## 2016-09-23 ENCOUNTER — Encounter: Payer: Self-pay | Admitting: Internal Medicine

## 2016-09-23 NOTE — Patient Instructions (Signed)
  Continue to advance diet. To have colonoscopy late Spring or perhaps early Summer since she will be traveling some.

## 2016-09-23 NOTE — Progress Notes (Signed)
   Subjective:    Patient ID: Ann Chambers, female    DOB: 29-Jul-1960, 56 y.o.   MRN: [id number]  HPI Patient has been on a soft diet for several days. She's tolerating it fine and she has no pain whatsoever. She was diagnosed with focal mild distal sigmoid diverticulitis on April 13. Was also found to have extensive diverticulosis throughout the colon.  She's been avoiding alcohol as she has been on Flagyl. She has now completed course of antibiotics.  She had appendectomy by Dr. Janan Halter  1996. Had negative gallbladder ultrasound in 1996.  On 05/26/1996, she had a 2 day history of right-sided abdominal pain with low-grade fever. White blood cell count was 14,000. Urinalysis was normal. Amylase was normal. Ultrasound of the gallbladder was negative. She subsequently had CT of the abdomen and pelvis and was found to have an inflammatory mass in the right mid:. Dr. Excell Seltzer admitted her for IV fluids, bowel rest and antibiotics. She was subsequently seen by gastroenterologist who felt that she may of had an inflammatory mass from a lot of aspirin consumption. She was taking up to 8 aspirin daily. He did not feel she had inflammatory bowel disease. She had been treated with IV Unasyn and white count had improved and she was afebrile as of December 30,1997.   She had colonoscopy March 1998. She had prominent submucosal ileal vessel but of doubtful clinical significance. She had mild sigmoid diverticulosis.  History of LEEP procedure for cervical dysplasia in 1996 by Dr. Dema Severin in [facility].    Review of Systems see above     Objective:   Physical Exam  Abdomen is soft nondistended without hepatosplenomegaly masses or significant tenderness      Assessment & Plan:  Sigmoid diverticulitis-focal and involving distal sigmoid colon  Plan: Patient had a colonoscopy in 1998 but nonsense. She will be referred for colonoscopy in several weeks. She may advance her diet.

## 2016-09-29 ENCOUNTER — Ambulatory Visit (INDEPENDENT_AMBULATORY_CARE_PROVIDER_SITE_OTHER): Payer: BLUE CROSS/BLUE SHIELD | Admitting: Internal Medicine

## 2016-09-29 VITALS — BP 110/78 | HR 80 | Temp 98.4°F

## 2016-09-29 DIAGNOSIS — J3089 Other allergic rhinitis: Secondary | ICD-10-CM

## 2016-09-29 NOTE — Patient Instructions (Signed)
Gave .5cc of TGW on left arm ID Gave .5cc of MDM on Right arm ID  No reactions noted.   RTC in 2 weeks

## 2016-09-29 NOTE — Progress Notes (Signed)
Office visit for allergy injections

## 2016-10-13 DIAGNOSIS — K5793 Diverticulitis of intestine, part unspecified, without perforation or abscess with bleeding: Secondary | ICD-10-CM

## 2016-10-14 ENCOUNTER — Ambulatory Visit (INDEPENDENT_AMBULATORY_CARE_PROVIDER_SITE_OTHER): Payer: BLUE CROSS/BLUE SHIELD | Admitting: Internal Medicine

## 2016-10-14 ENCOUNTER — Telehealth: Payer: Self-pay

## 2016-10-14 VITALS — BP 120/88 | HR 76 | Temp 97.8°F

## 2016-10-14 DIAGNOSIS — I10 Essential (primary) hypertension: Secondary | ICD-10-CM | POA: Diagnosis not present

## 2016-10-14 DIAGNOSIS — Z8719 Personal history of other diseases of the digestive system: Secondary | ICD-10-CM

## 2016-10-14 DIAGNOSIS — J3089 Other allergic rhinitis: Secondary | ICD-10-CM | POA: Diagnosis not present

## 2016-10-14 MED ORDER — METRONIDAZOLE 500 MG PO TABS: 500.0000 mg | ORAL_TABLET | Freq: Three times a day (TID) | ORAL | 0 refills | Status: DC

## 2016-10-14 MED ORDER — CIPROFLOXACIN HCL 500 MG PO TABS: 500.0000 mg | ORAL_TABLET | Freq: Two times a day (BID) | ORAL | 0 refills | Status: DC

## 2016-10-14 NOTE — Telephone Encounter (Signed)
Sent her medication electronically, will inform patient when she come in at 1130 am today

## 2016-10-14 NOTE — Telephone Encounter (Signed)
Call in Cipro 500 mg #20, one po bid and Flagyl 500 mg #21, one po tid

## 2016-10-14 NOTE — Telephone Encounter (Signed)
Pt is coming in today for allergy shots but stated she is going out of town for 10 days to an [place] with no pharmacy. She said that she would like to know if she can have a medication sent in incase she gets diverticulitis again since she has not seen the GI doctor yet. She said she just wants to be proactive since she knows when she's gone on vacation she will not be able to get any medications.

## 2016-10-14 NOTE — Progress Notes (Signed)
   Subjective:    Patient ID: Ann Chambers, female    DOB: 03-09-1961, 56 y.o.   MRN: [id number]  HPI 56 year old Female with history of diverticulitis has appointment this summer to see Dr. Cristina Gong need for colonoscopy. She is going to [facility] for a week and wants refill on Cipro and Flagyl to take with her should she have recurrence of diverticulitis. She continues to watch her diet carefully but can probably liberalize it at this point in time. She has a history of hypertension and blood pressure is 120/88 today.    Review of Systems     Objective:   Physical Exam Not examined. Allergy injections to trees grasses and weeds given left upper arm and to dust and dust mites right upper arm.       Assessment & Plan:  Allergy immunotherapy 0.5cc TGW in left arm sq 0.5cc D-MDM in right arm sq No reaction, and pt tolerated well.  Essential hypertension  History of diverticulitis  Plan: Return in 2 weeks for allergy injections

## 2016-10-14 NOTE — Patient Instructions (Signed)
Refill on Cipro and Flagyl called in for you. Continue same medications and return in 2 weeks for allergy immunotherapy.

## 2016-11-04 ENCOUNTER — Encounter: Payer: Self-pay | Admitting: Internal Medicine

## 2016-11-04 ENCOUNTER — Ambulatory Visit (INDEPENDENT_AMBULATORY_CARE_PROVIDER_SITE_OTHER): Payer: BLUE CROSS/BLUE SHIELD | Admitting: Internal Medicine

## 2016-11-04 VITALS — BP 116/78 | HR 79 | Temp 98.7°F

## 2016-11-04 DIAGNOSIS — J3089 Other allergic rhinitis: Secondary | ICD-10-CM | POA: Diagnosis not present

## 2016-11-04 NOTE — Progress Notes (Signed)
0.5cc TGW in left arm sq 0.5cc D-MDM in right arm sq No reaction, and pt tolerated well. Skyah Hannon L, CMA  

## 2016-11-04 NOTE — Patient Instructions (Addendum)
0.5cc TGW in left arm sq 0.5cc D-MDM in right arm sq No reaction, and pt tolerated well. Cathi Hazan L, CMA  Return in 3 weeks per pt schedule.

## 2016-11-07 ENCOUNTER — Ambulatory Visit: Payer: BLUE CROSS/BLUE SHIELD | Admitting: Internal Medicine

## 2016-11-15 ENCOUNTER — Ambulatory Visit: Payer: BLUE CROSS/BLUE SHIELD | Admitting: Internal Medicine

## 2016-11-21 ENCOUNTER — Ambulatory Visit (INDEPENDENT_AMBULATORY_CARE_PROVIDER_SITE_OTHER): Payer: BLUE CROSS/BLUE SHIELD | Admitting: Internal Medicine

## 2016-11-21 DIAGNOSIS — J3089 Other allergic rhinitis: Secondary | ICD-10-CM | POA: Diagnosis not present

## 2016-11-21 NOTE — Progress Notes (Signed)
   Subjective:    Patient ID: Ann Chambers, female    DOB: 1960-06-14, 56 y.o.   MRN: [id number]  HPI Allergy immunotherapy today.  2 vaccines given as previously noted.  T-G-W left arm and M-DM right arm with dose 0.5cc each SQ    Review of Systems     Objective:   Physical Exam        Assessment & Plan:

## 2016-11-21 NOTE — Patient Instructions (Signed)
Gave .5cc of TGW on left arm SQ Gave .5cc of MDM on Right arm SQ No reactions noted, pt tolerated well.   RTC in 2 weeks

## 2016-12-15 ENCOUNTER — Ambulatory Visit (INDEPENDENT_AMBULATORY_CARE_PROVIDER_SITE_OTHER): Payer: Self-pay | Admitting: Internal Medicine

## 2016-12-15 ENCOUNTER — Encounter: Payer: Self-pay | Admitting: Internal Medicine

## 2016-12-15 VITALS — BP 118/76 | HR 59 | Temp 98.2°F

## 2016-12-15 DIAGNOSIS — J3089 Other allergic rhinitis: Secondary | ICD-10-CM

## 2016-12-15 DIAGNOSIS — J309 Allergic rhinitis, unspecified: Secondary | ICD-10-CM

## 2016-12-15 DIAGNOSIS — Z1211 Encounter for screening for malignant neoplasm of colon: Secondary | ICD-10-CM | POA: Diagnosis not present

## 2016-12-15 DIAGNOSIS — K5792 Diverticulitis of intestine, part unspecified, without perforation or abscess without bleeding: Secondary | ICD-10-CM | POA: Diagnosis not present

## 2016-12-15 DIAGNOSIS — I10 Essential (primary) hypertension: Secondary | ICD-10-CM

## 2016-12-15 NOTE — Progress Notes (Signed)
0.5cc TGW in left arm sq 0.5cc D-MDM in right arm sq No reaction, and pt tolerated well. Zai Chmiel L, CMA  

## 2016-12-15 NOTE — Patient Instructions (Addendum)
0.5cc TGW in left arm sq 0.5cc D-MDM in right arm sq  Return in 2 weeks

## 2017-01-02 ENCOUNTER — Encounter: Payer: Self-pay | Admitting: Internal Medicine

## 2017-01-02 ENCOUNTER — Ambulatory Visit (INDEPENDENT_AMBULATORY_CARE_PROVIDER_SITE_OTHER): Payer: Self-pay | Admitting: Internal Medicine

## 2017-01-02 VITALS — BP 112/72 | HR 74 | Temp 97.8°F | Wt 155.0 lb

## 2017-01-02 DIAGNOSIS — I10 Essential (primary) hypertension: Secondary | ICD-10-CM

## 2017-01-02 DIAGNOSIS — Z7189 Other specified counseling: Secondary | ICD-10-CM

## 2017-01-02 DIAGNOSIS — J3089 Other allergic rhinitis: Secondary | ICD-10-CM

## 2017-01-02 DIAGNOSIS — K5790 Diverticulosis of intestine, part unspecified, without perforation or abscess without bleeding: Secondary | ICD-10-CM

## 2017-01-02 MED ORDER — VALSARTAN 40 MG PO TABS: 40.0000 mg | ORAL_TABLET | Freq: Every day | ORAL | 0 refills | Status: DC

## 2017-01-02 NOTE — Progress Notes (Signed)
0.5cc TGW in left arm sq 0.5cc D-MDM in right arm sq No reaction, and pt tolerated well. Harutyun Monteverde L, CMA  

## 2017-01-02 NOTE — Progress Notes (Signed)
   Subjective:    Patient ID: Ann Chambers, female    DOB: Feb 16, 1961, 56 y.o.   MRN: [id number]  HPI She is here for discussion regarding Valsartan recall. She's been well controlled with valsartan and is really not interested in changing. We have located a pharmacy in town that tells [place] they have American made valsartan not one that was recalled from [place].    Review of Systems see above     Objective:   Physical Exam  Blood pressure check today. Allergy injections given. Depression screen done. She is to have colonoscopy in the near future to follow-up on diverticulitis.      Assessment & Plan:  Essential hypertension  Allergic rhinitis  Plan: Change pharmacies to [facility] for valsartan not made in [place].

## 2017-01-02 NOTE — Patient Instructions (Addendum)
Allergy shots given today. Return in 2 weeks for allergy injections. Change valsartan prescription to [facility] [address]

## 2017-02-03 ENCOUNTER — Ambulatory Visit (INDEPENDENT_AMBULATORY_CARE_PROVIDER_SITE_OTHER): Payer: 59 | Admitting: Internal Medicine

## 2017-02-03 ENCOUNTER — Encounter: Payer: Self-pay | Admitting: Internal Medicine

## 2017-02-03 VITALS — BP 120/84 | HR 81 | Temp 97.9°F

## 2017-02-03 DIAGNOSIS — J3089 Other allergic rhinitis: Secondary | ICD-10-CM

## 2017-02-03 NOTE — Progress Notes (Signed)
0.5cc TGW in left arm sq 0.5cc D-MDM in right arm sq No reaction, and pt tolerated well. Lenise Arena, CMA

## 2017-02-03 NOTE — Patient Instructions (Signed)
Allergy shots done today, return in 2 weeks

## 2017-02-06 DIAGNOSIS — Z1211 Encounter for screening for malignant neoplasm of colon: Secondary | ICD-10-CM | POA: Diagnosis not present

## 2017-02-06 DIAGNOSIS — K5730 Diverticulosis of large intestine without perforation or abscess without bleeding: Secondary | ICD-10-CM | POA: Diagnosis not present

## 2017-02-20 DIAGNOSIS — J301 Allergic rhinitis due to pollen: Secondary | ICD-10-CM | POA: Diagnosis not present

## 2017-02-20 DIAGNOSIS — H1045 Other chronic allergic conjunctivitis: Secondary | ICD-10-CM | POA: Diagnosis not present

## 2017-02-20 DIAGNOSIS — J3089 Other allergic rhinitis: Secondary | ICD-10-CM | POA: Diagnosis not present

## 2017-02-23 ENCOUNTER — Ambulatory Visit (INDEPENDENT_AMBULATORY_CARE_PROVIDER_SITE_OTHER): Payer: 59 | Admitting: Internal Medicine

## 2017-02-23 VITALS — BP 112/74 | HR 65 | Temp 98.5°F

## 2017-02-23 DIAGNOSIS — J309 Allergic rhinitis, unspecified: Secondary | ICD-10-CM

## 2017-02-23 DIAGNOSIS — J3089 Other allergic rhinitis: Secondary | ICD-10-CM

## 2017-02-23 NOTE — Patient Instructions (Signed)
Allergy shots given return in 3 weeks.

## 2017-02-23 NOTE — Progress Notes (Signed)
0.5cc TGW in left arm sq 0.5cc D-MDM in right arm sq No reaction, and pt tolerated well. Lenise Arena, CMA

## 2017-03-14 ENCOUNTER — Encounter: Payer: Self-pay | Admitting: Internal Medicine

## 2017-03-14 ENCOUNTER — Ambulatory Visit (INDEPENDENT_AMBULATORY_CARE_PROVIDER_SITE_OTHER): Payer: 59 | Admitting: Internal Medicine

## 2017-03-14 VITALS — BP 128/86 | HR 81 | Temp 97.8°F

## 2017-03-14 DIAGNOSIS — J309 Allergic rhinitis, unspecified: Secondary | ICD-10-CM | POA: Diagnosis not present

## 2017-03-14 DIAGNOSIS — J3089 Other allergic rhinitis: Secondary | ICD-10-CM

## 2017-03-14 NOTE — Patient Instructions (Signed)
Allergy shots given return in 3 weeks

## 2017-03-14 NOTE — Progress Notes (Signed)
0.5cc TGW in left arm sq 0.5cc D-MDM in right arm sq No reaction, and pt tolerated well. Lenise Arena, CMA

## 2017-03-16 ENCOUNTER — Ambulatory Visit: Payer: 59 | Admitting: Internal Medicine

## 2017-03-19 ENCOUNTER — Other Ambulatory Visit: Payer: Self-pay | Admitting: Internal Medicine

## 2017-04-04 ENCOUNTER — Ambulatory Visit (INDEPENDENT_AMBULATORY_CARE_PROVIDER_SITE_OTHER): Payer: 59 | Admitting: Internal Medicine

## 2017-04-04 ENCOUNTER — Encounter: Payer: Self-pay | Admitting: Internal Medicine

## 2017-04-04 VITALS — BP 110/72 | HR 61 | Temp 98.2°F

## 2017-04-04 DIAGNOSIS — J3089 Other allergic rhinitis: Secondary | ICD-10-CM

## 2017-04-04 DIAGNOSIS — J309 Allergic rhinitis, unspecified: Secondary | ICD-10-CM

## 2017-04-04 NOTE — Progress Notes (Signed)
0.5cc TGW in left arm sq 0.5cc D-MDM in right arm sq No reaction, and pt tolerated well. Lenise Arena, CMA

## 2017-04-04 NOTE — Patient Instructions (Signed)
Allergy shots given today return in 2 weeks

## 2017-04-24 ENCOUNTER — Ambulatory Visit (INDEPENDENT_AMBULATORY_CARE_PROVIDER_SITE_OTHER): Payer: 59 | Admitting: Internal Medicine

## 2017-04-24 VITALS — BP 104/70 | HR 78 | Temp 98.3°F

## 2017-04-24 DIAGNOSIS — J3089 Other allergic rhinitis: Secondary | ICD-10-CM | POA: Diagnosis not present

## 2017-04-24 NOTE — Patient Instructions (Signed)
0.5ccTGW in left arm sq 0.5cc D-MDM in right arm sq No reaction, pt tolerated well   

## 2017-04-24 NOTE — Progress Notes (Signed)
   Subjective:    Patient ID: Ann Chambers, female    DOB: 1960/10/19, 56 y.o.   MRN: [id number]  HPI Allergy vaccines given    Review of Systems     Objective:   Physical Exam        Assessment & Plan:

## 2017-04-25 ENCOUNTER — Ambulatory Visit: Payer: 59 | Admitting: Internal Medicine

## 2017-05-15 ENCOUNTER — Ambulatory Visit (INDEPENDENT_AMBULATORY_CARE_PROVIDER_SITE_OTHER): Payer: 59 | Admitting: Internal Medicine

## 2017-05-15 DIAGNOSIS — J3089 Other allergic rhinitis: Secondary | ICD-10-CM | POA: Diagnosis not present

## 2017-05-15 NOTE — Patient Instructions (Signed)
Patient received  0.5cc TGW in left arm sq 0.5cc D-MDM in right arm sq No reaction, pt tolerated well.

## 2017-05-15 NOTE — Progress Notes (Signed)
Allergy injections given  

## 2017-06-05 ENCOUNTER — Ambulatory Visit (INDEPENDENT_AMBULATORY_CARE_PROVIDER_SITE_OTHER): Payer: 59 | Admitting: Internal Medicine

## 2017-06-05 ENCOUNTER — Encounter: Payer: Self-pay | Admitting: Internal Medicine

## 2017-06-05 VITALS — BP 120/70 | HR 78 | Temp 98.0°F

## 2017-06-05 DIAGNOSIS — J3089 Other allergic rhinitis: Secondary | ICD-10-CM | POA: Diagnosis not present

## 2017-06-05 NOTE — Patient Instructions (Signed)
Allergy injections given  

## 2017-06-05 NOTE — Progress Notes (Signed)
Patient received  0.5cc TGW in left arm sq 0.5cc D-MDM in right arm sq No reaction, pt tolerated well.

## 2017-06-14 ENCOUNTER — Other Ambulatory Visit: Payer: Self-pay | Admitting: Internal Medicine

## 2017-06-14 DIAGNOSIS — Z0000 Encounter for general adult medical examination without abnormal findings: Secondary | ICD-10-CM

## 2017-06-14 DIAGNOSIS — Z1321 Encounter for screening for nutritional disorder: Secondary | ICD-10-CM

## 2017-06-14 DIAGNOSIS — Z1329 Encounter for screening for other suspected endocrine disorder: Secondary | ICD-10-CM

## 2017-06-14 DIAGNOSIS — Z13220 Encounter for screening for lipoid disorders: Secondary | ICD-10-CM

## 2017-06-14 DIAGNOSIS — I10 Essential (primary) hypertension: Secondary | ICD-10-CM

## 2017-06-16 DIAGNOSIS — J301 Allergic rhinitis due to pollen: Secondary | ICD-10-CM | POA: Diagnosis not present

## 2017-06-19 ENCOUNTER — Telehealth: Payer: Self-pay | Admitting: Internal Medicine

## 2017-06-19 DIAGNOSIS — J3089 Other allergic rhinitis: Secondary | ICD-10-CM | POA: Diagnosis not present

## 2017-06-19 NOTE — Telephone Encounter (Signed)
Patient called this morning stating that she had a bad case of diarrhea on Saturday that was very acute.  She slept for 14 hours and had a fever that 1 degree over normal.  She did not mention any pain, however, she does have history of diverticulitis.  So, she wanted to call in and see if she needed to be seen today.    On Sunday, the diarrhea turned to bright yellow, however, she still had no pain and the diarrhea definitely slowed down.  She did eat mashed potatoes and green beans and chicken on Sunday.    Spoke with Dr. Renold Genta and she advised that it was probably an incident of gastroenteritis since it didn't involve any pain and higher fever.  And, the color of the diarrhea was probably due to something that the patient had eaten.  Advised that patient should stick to bland foods today like mashed potatoes, grits, liquids until the diarrhea subsides and her stools return to normal.  Patient verbalized understanding of this conversation.  And, she is instructed to call our office should she begin to experience any symptoms of pain, fever or other diverticulitis symptoms from previously.  She understands.

## 2017-06-26 ENCOUNTER — Ambulatory Visit (INDEPENDENT_AMBULATORY_CARE_PROVIDER_SITE_OTHER): Payer: 59 | Admitting: Internal Medicine

## 2017-06-26 VITALS — BP 124/88 | HR 64 | Temp 98.2°F

## 2017-06-26 DIAGNOSIS — J3089 Other allergic rhinitis: Secondary | ICD-10-CM

## 2017-06-26 NOTE — Patient Instructions (Signed)
Patient received  0.5cc TGW in left arm sq 0.5cc D-MDM in right arm sq No reaction, pt tolerated well.

## 2017-06-26 NOTE — Progress Notes (Signed)
Allergy vaccines given

## 2017-07-13 ENCOUNTER — Other Ambulatory Visit: Payer: 59 | Admitting: Internal Medicine

## 2017-07-13 DIAGNOSIS — I10 Essential (primary) hypertension: Secondary | ICD-10-CM | POA: Diagnosis not present

## 2017-07-13 DIAGNOSIS — Z13220 Encounter for screening for lipoid disorders: Secondary | ICD-10-CM | POA: Diagnosis not present

## 2017-07-13 DIAGNOSIS — Z0000 Encounter for general adult medical examination without abnormal findings: Secondary | ICD-10-CM

## 2017-07-13 DIAGNOSIS — Z1329 Encounter for screening for other suspected endocrine disorder: Secondary | ICD-10-CM

## 2017-07-13 DIAGNOSIS — Z1321 Encounter for screening for nutritional disorder: Secondary | ICD-10-CM

## 2017-07-14 LAB — COMPLETE METABOLIC PANEL WITH GFR
AG Ratio: 1.7 (calc) (ref 1.0–2.5)
ALKALINE PHOSPHATASE (APISO): 79 U/L (ref 33–130)
ALT: 15 U/L (ref 6–29)
AST: 17 U/L (ref 10–35)
Albumin: 4.2 g/dL (ref 3.6–5.1)
BUN: 14 mg/dL (ref 7–25)
CO2: 26 mmol/L (ref 20–32)
CREATININE: 0.7 mg/dL (ref 0.50–1.05)
Calcium: 9.4 mg/dL (ref 8.6–10.4)
Chloride: 106 mmol/L (ref 98–110)
GFR, Est African American: 111 mL/min/{1.73_m2} (ref >=60)
GFR, Est Non African American: 96 mL/min/{1.73_m2} (ref >=60)
GLUCOSE: 81 mg/dL (ref 65–99)
Globulin: 2.5 g/dL (calc) (ref 1.9–3.7)
Potassium: 4.6 mmol/L (ref 3.5–5.3)
SODIUM: 140 mmol/L (ref 135–146)
Total Bilirubin: 0.3 mg/dL (ref 0.2–1.2)
Total Protein: 6.7 g/dL (ref 6.1–8.1)

## 2017-07-14 LAB — LIPID PANEL
CHOL/HDL RATIO: 2.3 (calc) (ref <5.0)
Cholesterol: 186 mg/dL (ref <200)
HDL: 82 mg/dL (ref >50)
LDL Cholesterol (Calc): 90 mg/dL (calc)
NON-HDL CHOLESTEROL (CALC): 104 mg/dL (ref <130)
Triglycerides: 55 mg/dL (ref <150)

## 2017-07-14 LAB — CBC WITH DIFFERENTIAL/PLATELET
BASOS PCT: 0.9 %
Basophils Absolute: 53 cells/uL (ref 0–200)
Eosinophils Absolute: 224 cells/uL (ref 15–500)
Eosinophils Relative: 3.8 %
HCT: 37.9 % (ref 35.0–45.0)
Hemoglobin: 12.9 g/dL (ref 11.7–15.5)
Lymphs Abs: 1369 cells/uL (ref 850–3900)
MCH: 30.4 pg (ref 27.0–33.0)
MCHC: 34 g/dL (ref 32.0–36.0)
MCV: 89.4 fL (ref 80.0–100.0)
MPV: 9.9 fL (ref 7.5–12.5)
Monocytes Relative: 6.8 %
Neutro Abs: 3853 cells/uL (ref 1500–7800)
Neutrophils Relative %: 65.3 %
PLATELETS: 276 10*3/uL (ref 140–400)
RBC: 4.24 10*6/uL (ref 3.80–5.10)
RDW: 12 % (ref 11.0–15.0)
TOTAL LYMPHOCYTE: 23.2 %
WBC: 5.9 10*3/uL (ref 3.8–10.8)
WBCMIX: 401 {cells}/uL (ref 200–950)

## 2017-07-14 LAB — TSH: TSH: 1.12 mIU/L (ref 0.40–4.50)

## 2017-07-14 LAB — VITAMIN D 25 HYDROXY (VIT D DEFICIENCY, FRACTURES): VIT D 25 HYDROXY: 54 ng/mL (ref 30–100)

## 2017-07-17 ENCOUNTER — Encounter: Payer: Self-pay | Admitting: Internal Medicine

## 2017-07-17 ENCOUNTER — Ambulatory Visit (INDEPENDENT_AMBULATORY_CARE_PROVIDER_SITE_OTHER): Payer: 59 | Admitting: Internal Medicine

## 2017-07-17 VITALS — BP 122/82 | HR 75 | Temp 98.0°F | Ht 63.75 in | Wt 157.0 lb

## 2017-07-17 DIAGNOSIS — Z8719 Personal history of other diseases of the digestive system: Secondary | ICD-10-CM

## 2017-07-17 DIAGNOSIS — Z0000 Encounter for general adult medical examination without abnormal findings: Secondary | ICD-10-CM

## 2017-07-17 DIAGNOSIS — J3089 Other allergic rhinitis: Secondary | ICD-10-CM | POA: Diagnosis not present

## 2017-07-17 DIAGNOSIS — I10 Essential (primary) hypertension: Secondary | ICD-10-CM | POA: Diagnosis not present

## 2017-07-17 DIAGNOSIS — Z91012 Allergy to eggs: Secondary | ICD-10-CM | POA: Diagnosis not present

## 2017-07-17 LAB — POCT URINALYSIS DIPSTICK
Appearance: NORMAL
Bilirubin, UA: NEGATIVE
GLUCOSE UA: NEGATIVE
KETONES UA: NEGATIVE
LEUKOCYTES UA: NEGATIVE
NITRITE UA: NEGATIVE
Odor: NORMAL
Protein, UA: NEGATIVE
RBC UA: NEGATIVE
SPEC GRAV UA: 1.015 (ref 1.010–1.025)
Urobilinogen, UA: 0.2 E.U./dL
pH, UA: 6.5 (ref 5.0–8.0)

## 2017-07-17 NOTE — Progress Notes (Signed)
Subjective:    Patient ID: Ann Chambers, female    DOB: Aug 16, 1960, 57 y.o.   MRN: [id number]  HPI 57 year old Female for health maintenance exam and evaluation of medical issues.  Does not take flu vaccine due to history of egg allergy although reaction is only nausea.  She is had the flu only once as a child.  She has a history of hypertension.  Has had a  bout of diverticulitis April 2018 and had colonoscopy by Dr. Cristina Gong after that which was benign with 10-year follow-up recommended.  History of environmental and seasonal allergies and takes allergy injections through this office.  [facility] but follows her and provides [place] with her vaccine.  Has allergies to trees grasses weeds dust and dust mite.  History of migraine headaches.  History of scoliosis.  She receives massage therapy for issues with back pain.  In June 2016 she saw Dr. Tonia Brooms for annual skin checkup.  There was an area in the right lateral lower thigh which is thought to be a lipoma which was removed.  Indeed pathology showed benign adipose tissue with delicate vascular spaces and thin septae.  No atypia was observed.  No known drug allergies.  Sees GYN for GYN exam.  In 2015 she has some chest pain and EKG was normal.  Appendectomy at [facility] in 1996.  Right shoulder surgery October 2005 and another right shoulder surgery in 2002.  Patient was seen in the emergency department with acute abdominal pain December 1997.  CT showed thickening of the midportion of the ascending colon.  Proceed with colonoscopy 1998 which was normal.  She had another colonoscopy in 2011 which was normal.  History of lactose intolerance and irritable bowel syndrome.  Social history: Married with no children.  She is employed as a Corporate investment banker.  Non-smoker.  Social alcohol consumption.  Family history: Father with history of hypertension, MI, CABG died with cerebral hemorrhage.  Mother with  history of cardiomyopathy and congestive heart failure died at 45 years of age.  One brother in good health.  One sister in good health.  Both parents with history of hypertension.  Sister with hypertension.        Review of Systems  Constitutional: Negative.   Respiratory: Negative.   Cardiovascular: Negative.   Gastrointestinal: Negative.   Genitourinary: Negative.   Psychiatric/Behavioral: Negative.        Objective:   Physical Exam  Constitutional: She is oriented to person, place, and time. She appears well-developed and well-nourished.  HENT:  Head: Normocephalic and atraumatic.  Right Ear: External ear normal.  Mouth/Throat: Oropharynx is clear and moist.  Eyes: Conjunctivae and EOM are normal. Pupils are equal, round, and reactive to light. Right eye exhibits no discharge. Left eye exhibits no discharge. No scleral icterus.  Neck: Neck supple. No JVD present. No thyromegaly present.  Cardiovascular: Normal rate, normal heart sounds and intact distal pulses.  No murmur heard. Pulmonary/Chest: Effort normal and breath sounds normal. She has no wheezes. She has no rales.  Breasts normal female  Abdominal: Soft. Bowel sounds are normal. She exhibits no distension and no mass. There is no tenderness. There is no rebound and no guarding.  Genitourinary:  Genitourinary Comments: Deferred to GYN  Musculoskeletal: She exhibits no edema.  Neurological: She is alert and oriented to person, place, and time. She has normal reflexes. No cranial nerve deficit. Coordination normal.  Skin: Skin is warm and dry. No  rash noted.  Psychiatric: She has a normal mood and affect. Her behavior is normal. Judgment and thought content normal.  Vitals reviewed.         Assessment & Plan:  Essential hypertension-stable on current regimen  Allergic rhinitis-receives allergy vaccines from [facility] through this office.  History of egg allergy-does not take flu vaccine  History of  diverticulitis with recent negative colonoscopy.  Patient will continue with allergy vaccines which she receives about every 2 weeks.  She will call if she develops flulike symptoms.  Will continue same antihypertensive medication.  Follow-up here in 6 months with blood pressure check and office visit.  Lab work reviewed with her and is entirely within normal limits.  She exercises regularly at gym.  Allergy vaccines given today.

## 2017-07-17 NOTE — Patient Instructions (Addendum)
Patient received  0.5cc TGW in left arm sq 0.5cc D-MDM in right arm sq No reaction, pt tolerated well  It was a pleasure to see you today.  Work within normal limits.  Continue same medications and return for office visit blood pressure check in 6 months.  Continue allergy injections approximately every 2 weeks.

## 2017-08-07 ENCOUNTER — Ambulatory Visit (INDEPENDENT_AMBULATORY_CARE_PROVIDER_SITE_OTHER): Payer: 59 | Admitting: Internal Medicine

## 2017-08-07 VITALS — BP 110/82 | HR 68 | Temp 98.1°F

## 2017-08-07 DIAGNOSIS — J3089 Other allergic rhinitis: Secondary | ICD-10-CM

## 2017-08-07 NOTE — Progress Notes (Signed)
Patient received  0.03cc TGW in left arm sq 0.03cc D-MDM in right arm sq No reaction, pt tolerated well 

## 2017-08-07 NOTE — Patient Instructions (Signed)
Allergy vaccines given

## 2017-08-29 ENCOUNTER — Encounter: Payer: Self-pay | Admitting: Internal Medicine

## 2017-08-29 DIAGNOSIS — Z1231 Encounter for screening mammogram for malignant neoplasm of breast: Secondary | ICD-10-CM | POA: Diagnosis not present

## 2017-08-31 ENCOUNTER — Ambulatory Visit: Payer: 59 | Admitting: Internal Medicine

## 2017-09-03 ENCOUNTER — Other Ambulatory Visit: Payer: Self-pay | Admitting: Internal Medicine

## 2017-09-05 ENCOUNTER — Ambulatory Visit: Payer: 59 | Admitting: Internal Medicine

## 2017-09-29 ENCOUNTER — Ambulatory Visit (INDEPENDENT_AMBULATORY_CARE_PROVIDER_SITE_OTHER): Payer: 59 | Admitting: Internal Medicine

## 2017-09-29 VITALS — BP 110/80 | HR 80 | Temp 98.8°F

## 2017-09-29 DIAGNOSIS — J3089 Other allergic rhinitis: Secondary | ICD-10-CM

## 2017-09-29 DIAGNOSIS — Z1159 Encounter for screening for other viral diseases: Secondary | ICD-10-CM

## 2017-09-29 NOTE — Patient Instructions (Signed)
Patient received  0.03cc TGW in left arm sq 0.03cc D-MDM in right arm sq No reaction, pt tolerated well 

## 2017-09-29 NOTE — Progress Notes (Signed)
In today for allergy injections

## 2017-10-02 NOTE — Addendum Note (Signed)
Addended by: Mady Haagensen on: 10/02/2017 01:00 PM   Modules accepted: Orders

## 2017-10-03 ENCOUNTER — Other Ambulatory Visit (INDEPENDENT_AMBULATORY_CARE_PROVIDER_SITE_OTHER): Payer: 59 | Admitting: Internal Medicine

## 2017-10-03 DIAGNOSIS — Z1159 Encounter for screening for other viral diseases: Secondary | ICD-10-CM | POA: Diagnosis not present

## 2017-10-04 LAB — MEASLES/MUMPS/RUBELLA IMMUNITY
Mumps IgG: 98.8 AU/mL
Rubella: <0.9 index — ABNORMAL LOW

## 2017-10-06 ENCOUNTER — Ambulatory Visit (INDEPENDENT_AMBULATORY_CARE_PROVIDER_SITE_OTHER): Payer: 59 | Admitting: Internal Medicine

## 2017-10-06 ENCOUNTER — Encounter: Payer: Self-pay | Admitting: Internal Medicine

## 2017-10-06 VITALS — BP 120/80 | HR 88 | Temp 98.3°F

## 2017-10-06 DIAGNOSIS — Z23 Encounter for immunization: Secondary | ICD-10-CM

## 2017-10-06 NOTE — Progress Notes (Signed)
   Subjective:    Patient ID: Ann Chambers, female    DOB: February 04, 1961, 57 y.o.   MRN: [id number]  HPI MMR given today as pt had insufficient immunity to [place] and will be traveling in the near future    Review of Systems     Objective:   Physical Exam  Not examined      Assessment & Plan:  MMR given

## 2017-10-06 NOTE — Patient Instructions (Signed)
MMR given.

## 2017-10-19 ENCOUNTER — Ambulatory Visit (INDEPENDENT_AMBULATORY_CARE_PROVIDER_SITE_OTHER): Payer: 59 | Admitting: Internal Medicine

## 2017-10-19 VITALS — BP 120/90 | HR 94 | Temp 98.2°F

## 2017-10-19 DIAGNOSIS — I10 Essential (primary) hypertension: Secondary | ICD-10-CM

## 2017-10-19 DIAGNOSIS — J3089 Other allergic rhinitis: Secondary | ICD-10-CM

## 2017-10-19 NOTE — Addendum Note (Signed)
Addended by: Elby Showers on: 10/19/2017 11:57 AM   Modules accepted: Level of Service

## 2017-10-19 NOTE — Patient Instructions (Addendum)
Patient received  0.04cc TGW in left arm sq 0.04cc D-MDM in right arm sq No reaction, pt tolerated well.  RTC 2 weeks

## 2017-10-19 NOTE — Progress Notes (Signed)
   Subjective:    Patient ID: Ann Chambers, female    DOB: [birth year], 57 y.o.   MRN: [id number]  HPI Allergy injections given by CMA    Review of Systems     Objective:   Physical Exam        Assessment & Plan:

## 2017-11-02 ENCOUNTER — Ambulatory Visit (INDEPENDENT_AMBULATORY_CARE_PROVIDER_SITE_OTHER): Payer: 59 | Admitting: Internal Medicine

## 2017-11-02 DIAGNOSIS — J3089 Other allergic rhinitis: Secondary | ICD-10-CM | POA: Diagnosis not present

## 2017-11-02 NOTE — Progress Notes (Signed)
Immunotherapy injections given by CMA

## 2017-11-02 NOTE — Patient Instructions (Signed)
Patient received  0.5cc TGW in left arm sq 0.5cc D-MDM in right arm sq No reaction, pt tolerated well.

## 2017-11-20 ENCOUNTER — Ambulatory Visit (INDEPENDENT_AMBULATORY_CARE_PROVIDER_SITE_OTHER): Payer: 59 | Admitting: Internal Medicine

## 2017-11-20 DIAGNOSIS — J3089 Other allergic rhinitis: Secondary | ICD-10-CM

## 2017-11-20 DIAGNOSIS — Z9109 Other allergy status, other than to drugs and biological substances: Secondary | ICD-10-CM | POA: Diagnosis not present

## 2017-11-20 NOTE — Patient Instructions (Signed)
Patient received  0.05cc TGW in left arm sq 0.05cc D-MDM in right arm sq No reaction, pt tolerated well 

## 2017-11-20 NOTE — Progress Notes (Signed)
Allergy injections given per CMA 

## 2017-12-11 ENCOUNTER — Ambulatory Visit (INDEPENDENT_AMBULATORY_CARE_PROVIDER_SITE_OTHER): Payer: 59 | Admitting: Internal Medicine

## 2017-12-11 DIAGNOSIS — J3089 Other allergic rhinitis: Secondary | ICD-10-CM | POA: Diagnosis not present

## 2017-12-11 NOTE — Patient Instructions (Signed)
Patient received  0.05cc TGW in left arm sq 0.05cc D-MDM in right arm sq No reaction, pt tolerated well 

## 2017-12-11 NOTE — Progress Notes (Signed)
Allergy injections given  

## 2018-01-01 ENCOUNTER — Ambulatory Visit (INDEPENDENT_AMBULATORY_CARE_PROVIDER_SITE_OTHER): Payer: 59 | Admitting: Internal Medicine

## 2018-01-01 DIAGNOSIS — J3089 Other allergic rhinitis: Secondary | ICD-10-CM

## 2018-01-01 NOTE — Progress Notes (Signed)
Allergy injections given by CMA

## 2018-01-01 NOTE — Patient Instructions (Signed)
Patient received  0.05cc TGW in left arm sq 0.05cc D-MDM in right arm sq No reaction, pt tolerated well 

## 2018-01-23 ENCOUNTER — Ambulatory Visit (INDEPENDENT_AMBULATORY_CARE_PROVIDER_SITE_OTHER): Payer: 59 | Admitting: Internal Medicine

## 2018-01-23 DIAGNOSIS — J3089 Other allergic rhinitis: Secondary | ICD-10-CM | POA: Diagnosis not present

## 2018-01-23 NOTE — Patient Instructions (Signed)
Patient received  0.05cc TGW in left arm sq 0.05cc D-MDM in right arm sq No reaction, pt tolerated well 

## 2018-01-23 NOTE — Progress Notes (Signed)
For immunotherapy today. Tells me sister has been dx with lung CA and is nonsmoker.Pt had cardiac CT last year for cardiac scoring but has not had any screening for lung CA. Will refer to geneticist at [facility] for discussion. Says sister had some genetic mutations but I do not know what they mean by this exactly and have no copy of these results. What I do know is sister had 12 mm non small cell adenocarcinoma with ALK gene rearrangement notdetected,ROS1 gene rearangement not detected. BRAF mutation not detected,KRAS mutation not detected, EGFR exon 18-21 mitations not detected. However, PD-L1 42V9 FDA Keytruda R for NSCLC expressed

## 2018-01-25 ENCOUNTER — Encounter: Payer: Self-pay | Admitting: Genetics

## 2018-02-13 ENCOUNTER — Ambulatory Visit (INDEPENDENT_AMBULATORY_CARE_PROVIDER_SITE_OTHER): Payer: 59 | Admitting: Internal Medicine

## 2018-02-13 DIAGNOSIS — J3089 Other allergic rhinitis: Secondary | ICD-10-CM | POA: Diagnosis not present

## 2018-02-13 NOTE — Progress Notes (Signed)
Allergy injections given by CMA

## 2018-02-13 NOTE — Patient Instructions (Addendum)
Patient received  0.05cc TGW in left arm sq 0.05cc D-MDM in right arm sq No reaction, pt tolerated well

## 2018-02-22 ENCOUNTER — Inpatient Hospital Stay: Payer: 59

## 2018-02-22 ENCOUNTER — Inpatient Hospital Stay: Payer: 59 | Attending: Genetic Counselor | Admitting: Genetics

## 2018-02-22 DIAGNOSIS — Z8042 Family history of malignant neoplasm of prostate: Secondary | ICD-10-CM

## 2018-02-22 DIAGNOSIS — Z801 Family history of malignant neoplasm of trachea, bronchus and lung: Secondary | ICD-10-CM

## 2018-02-23 ENCOUNTER — Encounter: Payer: Self-pay | Admitting: Genetics

## 2018-02-23 DIAGNOSIS — Z801 Family history of malignant neoplasm of trachea, bronchus and lung: Secondary | ICD-10-CM | POA: Insufficient documentation

## 2018-02-23 DIAGNOSIS — Z8042 Family history of malignant neoplasm of prostate: Secondary | ICD-10-CM | POA: Insufficient documentation

## 2018-02-23 NOTE — Progress Notes (Signed)
REFERRING PROVIDER: Elby Showers, MD [address] [place], [place] [address]  PRIMARY PROVIDER:  Elby Showers, MD  PRIMARY REASON FOR VISIT:  1. Family history of lung cancer   2. Family history of prostate cancer     HISTORY OF PRESENT ILLNESS:   Ann Chambers, a 57 y.o. female, was seen for a [facility] cancer genetics consultation at the request of Dr. Renold Genta due to a family history of cancer.  Ann Chambers presents to clinic today to discuss the possibility of a hereditary predisposition to cancer, genetic testing, and to further clarify her future cancer risks, as well as potential cancer risks for family members.   Ann Chambers is a 57 y.o. female with no personal history of cancer.   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   HORMONAL RISK FACTORS:  Menarche was at age 3.  First live birth at age N/A.  OCP use: yes years.  Ovaries intact: yes.  Hysterectomy: no.  Menopausal status: postmenopausal.  HRT use: 0 years. Colonoscopy: yes; Sept 2019 diverticulosis, no polyps, next in 10 yrs. Mammogram within the last year: yes.  Past Medical History:  Diagnosis Date  . Allergic rhinitis   . Egg allergy   . Essential hypertension   . Family history of lung cancer   . Family history of prostate cancer   . Migraines    Resolved after menopause  . Scoliosis     Past Surgical History:  Procedure Laterality Date  . APPENDECTOMY    . right shoulder surgery 2002 and 2005      Social History   Socioeconomic History  . Marital status: Married    Spouse name: Not on file  . Number of children: Not on file  . Years of education: Not on file  . Highest education level: Not on file  Occupational History  . Not on file  Social Needs  . Financial resource strain: Not on file  . Food insecurity:    Worry: Not on file    Inability: Not on file  . Transportation needs:    Medical: Not on file     Non-medical: Not on file  Tobacco Use  . Smoking status: Never Smoker  . Smokeless tobacco: Never Used  Substance and Sexual Activity  . Alcohol use: Yes    Alcohol/week: 3.0 standard drinks    Types: 3 Glasses of wine per week    Comment: Glass of wine 3-5 nights a week  . Drug use: No  . Sexual activity: Yes    Birth control/protection: Post-menopausal  Lifestyle  . Physical activity:    Days per week: Not on file    Minutes per session: Not on file  . Stress: Not on file  Relationships  . Social connections:    Talks on phone: Not on file    Gets together: Not on file    Attends religious service: Not on file    Active member of club or organization: Not on file    Attends meetings of clubs or organizations: Not on file    Relationship status: Not on file  Other Topics Concern  . Not on file  Social History Narrative  . Not on file     FAMILY HISTORY:  We obtained a detailed, 4-generation family history.  Significant diagnoses are listed below: Family History  Problem Relation Age of Onset  . CAD Father  h/o bypass in his 44s, possible heart issues in his 71s  . Hypertension Father   . Prostate cancer Father        was not metastatic, but his doctors were concerned it could spread  . Heart failure Mother        Rheumatic fever, nonischemic  . Hypertension Mother   . Rheumatic fever Mother   . Heart attack Paternal Grandmother   . Heart disease Paternal Grandfather   . Hypertension Sister   . Lung cancer Sister 57       non smoker- had genetic testing and was positve for a gene- thinks it was EGFR?  . Heart attack Paternal Aunt   . Kidney disease Paternal Uncle   . Other Paternal Barbaraann Rondo        'ruptured aorta'    Ms.  has no children.  She has a sister who is 25 and recently diagnosed with lung cancer.  She is a non-smoker.  She had genetic testing and told her sister (the patient) she needed to get genetically tested.  She thinks she was positive for  EGFR. She is going to try to get a copy of her sister's test report and send it to me.   Ann Chambers father: died at 2 due to complications from prostate cancer surgery/treatment.  (not the prostate cancer itself).  His prostate cancer was dx at 75 and his doctors felt it was due to his agent orange exposure. It did not metastasize, but his doctors were concerned this may happen.  Paternal Aunts/Uncles: 2 paternal uncles 1 paternal aunt: -1 paternal uncle died at 5 due to kidney disease, not cancer -1 paternal uncle died of a ruptured aorta at 75 -paternal aunt died at 68 due to heart attack Paternal cousins: no hx of cancer Paternal grandfather: died of heart disease at 17 Paternal grandmother:died at 69 due to heart attack, hx of smoking  Ms. Burtis's mother: died at 60 due to heart failure- she had a hx of Rheumatic fever.  She was adopted so no information is known about the rest of the maternal family history.   Patient's maternal ancestors are of [place] descent, and paternal ancestors are of Polish/English/Irish descent. She has 5% Ashkenazi Jewish Ancestry reported on [employer] ancestry genetic testing.  There is no known consanguinity.  GENETIC COUNSELING ASSESSMENT:  We discussed and recommended the following at today's visit.   DISCUSSION: We reviewed the characteristics, features and inheritance patterns of hereditary cancer syndromes. We also discussed genetic testing, including the appropriate family members to test, the process of testing, insurance coverage and turn-around-time for results. We discussed the implications of a negative, positive and/or variant of uncertain significant result. We offered Ann Chambers the option to pursue genetic testing for the Multi-Cancer gene panel.   The Multi-Cancer Panel offered by [facility] includes sequencing and/or deletion duplication testing of the following 91 genes: AIP, ALK, APC, ATM, AXIN2, BAP1, BARD1, BLM, BMPR1A, BRCA1, BRCA2,  BRIP1, BUB1B, CASR, CDC73, CDH1, CDK4, CDKN1B, CDKN1C, CDKN2A, CEBPA, CEP57, CHEK2, CTNNA1, DICER1, DIS3L2, EGFR, ENG, EPCAM, FH, FLCN, GALNT12, GATA2, GPC3, GREM1, HOXB13, HRAS, KIT, MAX, MEN1, MET, MITF, MLH1, MLH3, MSH2, MSH3, MSH6, MUTYH, NBN, NF1, NF2, NTHL1, PALB2, PDGFRA, PHOX2B, PMS2, POLD1, POLE, POT1, PRKAR1A, PTCH1, PTEN, RAD50, RAD51C, RAD51D, RB1, RECQL4, RET, RNF43, RPS20, RUNX1, SDHA, SDHAF2, SDHB, SDHC, SDHD, SMAD4, SMARCA4, SMARCB1, SMARCE1, STK11, SUFU, TERC, TERT, TMEM127, TP53, TSC1, TSC2, VHL, WRN, WT1  We discussed that only 5-10% of cancers are associated  with a Hereditary cancer predisposition syndrome.    Information about EGFR Mutations: The EGFR c.2369C>T (p.Thr790Met) variant, also known as T790M, has been identified as a rare cause of hereditary predisposition to non-small cell lung cancer (PMID: 07622633, 35456256, 38937342, 87681157). The risk for carriers of this variant to develop non-small cell lung cancer is estimated to be 23%, although data are limited by the very small number of reported cases (PMID: 26203559). The average age at diagnosis is 58 years, with a range of ages similar to diagnoses of BRCA1 carriers (PMID: 74163845). It is estimated that 1% of non-small cell lung cancer patients and less than 1 in 7500 individuals in the general population are carriers of this hereditary variant (PMID: 36468032).  Although based on small studies, the T790M variant in EGFR appears to confer a higher risk of lung cancer in non-smokers (31%) than in smokers (15%; PMID: 12248250). The molecular pathways to the development of non-hereditary lung adenocarcinoma in smokers versus non-smokers tend to be different: pathogenic variants in the KRAS gene are the most common driver in smokers, while in non-smokers it appears to be driven by deleterious variants in EGFR (PMID: 03704888). These pathways in lung cancer development seem to be mutually exclusive; (PMID: 91694503).   EGFR  T790M is most commonly tested for as a somatic variant in the lung tumor tissue of patients with a cancer diagnosis. It has been hypothesized that the T790M mutation is growth promoting by increasing kinase activity and downstream signaling (PMID: 88828003). Activating EGFR mutations typically predict response to tyrosine kinase inhibitors such as gefitinib. However, the T790M mutation confers resistance to these treatments, and, if present, is typically acquired over the course of treatment. Identification of the EGFR T790M variant prior to treatment may be suggestive of its presence in the germline (PMID: 49179150).  Management While there are no established screening or surveillance guidelines for individuals with the T790M variant, regular CT scans for early detection of lung cancer in carriers have been suggested (PMID: 56979480, 16553748). Ongoing studies looking at patients and relatives with this variant may further clarify appropriate screening in the future, such as the INHERIT EGFR study: Investigating the hereditary risk from T790M (https://kelly-hunt.org/). An individual's cancer risk and medical management are not determined by genetic test results alone. Overall cancer risk assessment incorporates additional factors, including personal medical history, family history, and any available genetic information that may result in a personalized plan for cancer prevention and surveillance. Although data regarding EGFR T790M germline variant are limited, knowing if this variant is present is advantageous. At-risk relatives can be identified, enabling pursuit of a diagnostic evaluation. Further, the available information regarding hereditary cancer susceptibility genes is constantly evolving, and more clinically relevant data regarding EGFR are likely to become available in the near future. Awareness of this cancer predisposition encourages patients and their providers to inform  at-risk family members, to consider implementing proposed screening protocols, and to be vigilant in maintaining close and regular contact with their local genetics clinic in anticipation of new information.  POSSIBLE RESULTS: We discussed that if she is found to have a mutation in one of these genes, it may impact future medical management recommendations such as increased cancer screenings and consideration of risk reducing surgeries.  A positive result could also have implications for the patient's family members.  A Negative result would mean we were unable to identify a hereditary predisposition to cancer in her cancer, but does not rule out the possibility of a hereditary risk for  cancer.  There could be mutations that are undetectable by current technology, or in genes not yet tested or identified to increase cancer risk.    We discussed the potential to find a Variant of Uncertain Significance or VUS.  These are variants that have not yet been identified as pathogenic or benign, and it is unknown if this variant is associated with increased cancer risk or if this is a normal finding.  Most VUS's are reclassified to benign or likely benign.   It should not be used to make medical management decisions. With time, we suspect the lab will determine the significance of any VUS's identified if any.   Ann Chambers does not meet any NCCN guidelines for genetic testing.  At this time we are unable to confirm her sister's genetic test results. .  We therefore feel it is unlikely her insurance will cover the cost of her genetic testing.  We also offered Ann Chambers the option to self-pay for the testing.  The self-pay cost is $250.  She was given the name of the laboratory if she has further billing questions.  Ann Chambers elected to pursue the self-pay option.   We discussed that some people do not want to undergo genetic testing due to fear of genetic discrimination.  A federal law called the Genetic  Information Non-Discrimination Act (GINA) of 2008 helps protect individuals against genetic discrimination based on their genetic test results.  It impacts both health insurance and employment.  For health insurance, it protects against increased premiums, being kicked off insurance or being forced to take a test in order to be insured.  For employment it protects against hiring, firing and promoting decisions based on genetic test results.  Health status due to a cancer diagnosis is not protected under GINA.  This law does not protect life insurance, disability insurance, or other types of insurance.   PLAN: After considering the risks, benefits, and limitations, Ann Chambers  provided informed consent to pursue genetic testing and the blood sample was sent to [facility] for analysis of the Multi-Cancer Panel. Results should be available within approximately 2-3 weeks' time, at which point they will be disclosed by telephone to Ann Chambers, as will any additional recommendations warranted by these results. Ann Chambers will receive a summary of her genetic counseling visit and a copy of her results once available. This information will also be available in Epic. We encouraged Ann Chambers to remain in contact with cancer genetics annually so that we can continuously update the family history and inform her of any changes in cancer genetics and testing that may be of benefit for her family. Ann Chambers questions were answered to her satisfaction today. Our contact information was provided should additional questions or concerns arise.  Lastly, we encouraged Ann Chambers to remain in contact with cancer genetics annually so that we can continuously update the family history and inform her of any changes in cancer genetics and testing that may be of benefit for this family.   Ms.  Chambers questions were answered to her satisfaction today. Our contact information was provided should additional questions  or concerns arise. Thank you for the referral and allowing [place] to share in the care of your patient.   Tana Felts, MS, [facility] Certified Genetic Counselor [email or web address] phone: [phone]  The patient was seen for a total of 40 minutes in face-to-face genetic counseling.  This patient was discussed with Drs. Magrinat, Lindi Adie and/or Burr Medico who agrees with the  above.

## 2018-03-06 ENCOUNTER — Other Ambulatory Visit: Payer: Self-pay | Admitting: Internal Medicine

## 2018-03-06 ENCOUNTER — Telehealth: Payer: Self-pay | Admitting: Genetics

## 2018-03-08 ENCOUNTER — Ambulatory Visit (INDEPENDENT_AMBULATORY_CARE_PROVIDER_SITE_OTHER): Payer: 59 | Admitting: Internal Medicine

## 2018-03-08 ENCOUNTER — Encounter: Payer: Self-pay | Admitting: Internal Medicine

## 2018-03-08 DIAGNOSIS — J3089 Other allergic rhinitis: Secondary | ICD-10-CM

## 2018-03-08 NOTE — Progress Notes (Signed)
Allergy vaccines given by CMA

## 2018-03-08 NOTE — Patient Instructions (Signed)
Patient received  0.05cc TGW in left arm sq 0.05cc D-MDM in right arm sq No reaction, pt tolerated well

## 2018-03-12 ENCOUNTER — Ambulatory Visit: Payer: Self-pay | Admitting: Genetics

## 2018-03-12 ENCOUNTER — Encounter: Payer: Self-pay | Admitting: Genetics

## 2018-03-12 DIAGNOSIS — Z8042 Family history of malignant neoplasm of prostate: Secondary | ICD-10-CM

## 2018-03-12 DIAGNOSIS — Z1589 Genetic susceptibility to other disease: Secondary | ICD-10-CM

## 2018-03-12 DIAGNOSIS — Z1379 Encounter for other screening for genetic and chromosomal anomalies: Secondary | ICD-10-CM

## 2018-03-12 DIAGNOSIS — Z801 Family history of malignant neoplasm of trachea, bronchus and lung: Secondary | ICD-10-CM

## 2018-03-12 NOTE — Progress Notes (Signed)
HPI:  Ms. Thom was previously seen in the [facility] on 02/22/2018 due to a family history of cancer and concerns regarding a hereditary predisposition to cancer. Please refer to our prior cancer genetics clinic note for more information regarding Ms. [employer] medical, social and family histories, and our assessment and recommendations, at the time. Ms. Waterfield recent genetic test results were disclosed to her, as well as recommendations warranted by these results. These results and recommendations are discussed in more detail below.  CANCER HISTORY:   No history exists.     FAMILY HISTORY:  We obtained a detailed, 4-generation family history.  Significant diagnoses are listed below: Family History  Problem Relation Age of Onset  . CAD Father        h/o bypass in his 52s, possible heart issues in his 62s  . Hypertension Father   . Prostate cancer Father        was not metastatic, but his doctors were concerned it could spread  . Heart failure Mother        Rheumatic fever, nonischemic  . Hypertension Mother   . Rheumatic fever Mother   . Heart attack Paternal Grandmother   . Heart disease Paternal Grandfather   . Hypertension Sister   . Lung cancer Sister 73       non smoker- had genetic testing and was positve for a gene- thinks it was EGFR?  . Heart attack Paternal Aunt   . Kidney disease Paternal Uncle   . Other Paternal Barbaraann Rondo        'ruptured aorta'    Ms.  has no children.  She has a sister who is 73 and recently diagnosed with lung cancer.  She is a non-smoker.  She had genetic testing and told her sister (the patient) she needed to get genetically tested.  She thinks she was positive for EGFR. She is going to try to get a copy of her sister's test report and send it to me.   Ms. Chamberlin father: died at 74 due to complications from prostate cancer surgery/treatment.  (not the prostate cancer itself).  His prostate cancer was dx at 65 and his  doctors felt it was due to his agent orange exposure. It did not metastasize, but his doctors were concerned this may happen.  Paternal Aunts/Uncles: 2 paternal uncles 1 paternal aunt: -1 paternal uncle died at 34 due to kidney disease, not cancer -1 paternal uncle died of a ruptured aorta at 24 -paternal aunt died at 72 due to heart attack Paternal cousins: no hx of cancer Paternal grandfather: died of heart disease at 31 Paternal grandmother:died at 58 due to heart attack, hx of smoking  Ms. Sculley's mother: died at 40 due to heart failure- she had a hx of Rheumatic fever.  She was adopted so no information is known about the rest of the maternal family history.   Patient's maternal ancestors are of [place] descent, and paternal ancestors are of Polish/English/Irish descent. She has 5% Ashkenazi Jewish Ancestry reported on [employer] ancestry genetic testing.  There is no known consanguinity.  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>A (p.Gly396Asp)  The Multi-Cancer Panel offered by [facility] includes sequencing and/or deletion duplication testing of the following 91 genes: AIP, ALK, APC, ATM, AXIN2, BAP1, BARD1, BLM, BMPR1A, BRCA1, BRCA2, BRIP1, BUB1B, CASR, CDC73, CDH1, CDK4, CDKN1B, CDKN1C, CDKN2A, CEBPA,  CEP57, CHEK2, CTNNA1, DICER1, DIS3L2, EGFR, ENG, EPCAM, FH, FLCN, GALNT12, GATA2, GPC3, GREM1, HOXB13, HRAS, KIT, MAX, MEN1, MET, MITF, MLH1, MLH3, MSH2, MSH3, MSH6, MUTYH, NBN, NF1, NF2, NTHL1, PALB2, PDGFRA, PHOX2B, PMS2, POLD1, POLE, POT1, PRKAR1A, PTCH1, PTEN, RAD50, RAD51C, RAD51D, RB1, RECQL4, RET, RNF43, RPS20, RUNX1, SDHA, SDHAF2, SDHB, SDHC, SDHD, SMAD4, SMARCA4, SMARCB1, SMARCE1, STK11, SUFU, TERC, TERT, TMEM127, TP53, TSC1, TSC2, VHL, WRN, WT1  The test report will be scanned into EPIC and will be located under the Molecular  Pathology section of the Results Review tab. A portion of the result report is included below for reference.     We discussed with Ms. Kwasny that because current genetic testing is not perfect, it is possible there may be a gene mutation in one of these genes that current testing cannot detect, but that chance is small.  We also discussed, that there could be another gene that has not yet been discovered, or that we have not yet tested, that is responsible for the cancer diagnoses in the family. It is also possible there is a hereditary cause for the cancer in the family that Ms. Guest did not inherit and therefore was not identified in her testing.  Therefore, it is important to remain in touch with cancer genetics in the future so that we can continue to offer Ms. Hankinson the most up to date genetic testing.   ADDITIONAL GENETIC TESTING: We discussed with Ms. Covington that her genetic testing was fairly extensive.  If there are are genes identified to increase cancer risk that can be analyzed in the future, we would be happy to discuss and coordinate this testing at that time.    MUTYH:  MUTYH- Associated Polyposis is an autosomal recessive condition that increases the risk to develop multiple colon/duodenal polyps and increases cancer risk.   We reviewed recessive inheritance and discussed when an individual has two MUTYH pathogenic mutations, this is associated with an increased risk for adenomatous (precancerous) colon polyps.  Based on current data, the risk for colon cancer/polyps in individuals who are MUTYH heterozygotes (only 1 mutation) is moderately increased at most.   Only One MUTYH pathogenic variant was detected in Ms. Mohammed Kindle.  While this is reassuring, there is always a small chance that genetic testing did not detect all possible variants, as the technology is constantly evolving.    No other pathogenic variants were identified in any of the other genes analyzed on this  panel.  This means that we have not identified a hereditary cause for her family's history of  cancer at this time. We did not identify any hereditary predisposition to cancer in Ms. Mohammed Kindle.   While reassuring, this does not definitively rule out a hereditary predisposition to cancer. It is still possible that there could be genetic mutations that are undetectable by current technology, or genetic mutations in genes that have not been tested or identified to increase cancer risk.  Therefore, it is recommended she continue to follow the cancer management and screening guidelines provided by her oncology and primary healthcare provider. An individual's cancer risk is not determined by genetic test results alone.  Overall cancer risk assessment includes additional factors such as personal medical history, family history, etc.  These should be used to make a personalized plan for cancer prevention and surveillance.    SCREENING RECOMMENDATIONS:  We recommended  Ms. Leopard follow management guidelines for heterozygous MUTYH mutations; all of which are outlined below. These are from the NCCN 1.2018 guidelines,  and are subject to change.    MUTYH heterozygote with no personal history of colon cancer and No family history of colon cancer: Data are uncertain if specialized screening is warranted.   Ms. Aggie Cosier does not report any family history of colon cancer.   It is important for Hagberg to follow general population colon screening guidelines, and any recommendations provided to her by her GI or other healthcare providers.     RECOMMENDATIONS FOR FAMILY MEMBERS:   We recommend all of Pottle's family members have genetic counseling and genetic testing. Because the MUTYH pathogenic variant has been identified in Ms. Mohammed Kindle, we can offer genetic testing to relatives to determine if they also carry the MUTYH familial mutation.  As discussed above, having only 1 MUTYH mutation would not significantly impact  their colon cancer risk/screening.  However, if any relatives have inherited 2 MUTYH mutations (in trans), this would significantly impact their cancer risk.  About 1-2% of the [place] population carries a single MUTYH pathogenic variant, so there is a chance some relatives may have inherited MUTYH mutations from both of their parents.  We would be happy to meet with any of the family members or refer them to a genetic counselor in their local area.  To locate genetic counselors in other cities, individuals can visit the website of the [employer] ([email or web address]) and Secretary/administrator for a Social worker by zip code.   Relatives in this family might be at some increased risk of developing cancer, over the general population risk, simply due to the family history of cancer.  We recommended women in this family have a yearly mammogram beginning at age 8, or 71 years younger than the earliest onset of cancer, an annual clinical breast exam, and perform monthly breast self-exams. Women in this family should also have a gynecological exam as recommended by their primary provider. All family members should have a colonoscopy as directed by their 70.  All family members should inform their physicians about the family history of cancer so their doctors can make the most appropriate screening recommendations for them.   FOLLOW-UP: Lastly, we discussed with Ms. Bakula that cancer genetics is a rapidly advancing field and it is possible that new genetic tests will be appropriate for her and/or her family members in the future. We encouraged her to remain in contact with cancer genetics on an annual basis so we can update her personal and family histories and let her know of advances in cancer genetics that may benefit this family.   Our contact number was provided. Ms. Deiter questions were answered to her satisfaction, and she knows she is welcome to call us at anytime with additional  questions or concerns.   Ferol Luz, MS, [facility] Certified Genetic Counselor [email or web address]

## 2018-03-12 NOTE — Telephone Encounter (Signed)
error 

## 2018-03-30 ENCOUNTER — Ambulatory Visit (INDEPENDENT_AMBULATORY_CARE_PROVIDER_SITE_OTHER): Payer: 59 | Admitting: Internal Medicine

## 2018-03-30 ENCOUNTER — Encounter: Payer: Self-pay | Admitting: Internal Medicine

## 2018-03-30 VITALS — BP 120/88 | HR 80 | Temp 98.1°F | Ht 63.75 in | Wt 157.0 lb

## 2018-03-30 DIAGNOSIS — J3089 Other allergic rhinitis: Secondary | ICD-10-CM

## 2018-03-30 DIAGNOSIS — H0014 Chalazion left upper eyelid: Secondary | ICD-10-CM

## 2018-03-30 DIAGNOSIS — H1033 Unspecified acute conjunctivitis, bilateral: Secondary | ICD-10-CM | POA: Diagnosis not present

## 2018-03-30 MED ORDER — OFLOXACIN 0.3 % OP SOLN: 1.0000 [drp] | Freq: Four times a day (QID) | OPHTHALMIC | 1 refills | Status: DC

## 2018-03-30 NOTE — Progress Notes (Signed)
   Subjective:    Patient ID: Ann Chambers, female    DOB: [birth year], 57 y.o.   MRN: [id number]  HPI Here for allergy immunotherapy today but has developed chalazion left upper eyelid. Was in [place] recently. It was windy. Something may have blown into right eye. Later noted swelling left upper eyelid. Not wearing eye make up or contact lenses  right now due to discomfort.    Review of Systems see above     Objective:   Physical Exam  Conjunctivae inflamed bilaterally but no discolored drainage.  Small chalazion noted left upper eyelid      Assessment & Plan:  Bilateral conjunctivitis  Chalazion left upper light lid  Plan: Warm hot compresses to left upper eyelid 20 minutes at least twice daily.  If not improving will refer to ophthalmologist.  Ocuflox ophthalmic drops 2 drops in each eye 4 times a day for conjunctivitis.  Allergy immunotherapy injections given today

## 2018-03-30 NOTE — Patient Instructions (Signed)
Patient received  0.05cc TGW in left arm sq 0.05cc D-MDM in right arm sq No reaction, pt tolerated well

## 2018-04-02 DIAGNOSIS — H1045 Other chronic allergic conjunctivitis: Secondary | ICD-10-CM | POA: Diagnosis not present

## 2018-04-02 DIAGNOSIS — J3089 Other allergic rhinitis: Secondary | ICD-10-CM | POA: Diagnosis not present

## 2018-04-02 DIAGNOSIS — J301 Allergic rhinitis due to pollen: Secondary | ICD-10-CM | POA: Diagnosis not present

## 2018-04-17 ENCOUNTER — Ambulatory Visit: Payer: 59 | Admitting: Internal Medicine

## 2018-04-20 ENCOUNTER — Ambulatory Visit (INDEPENDENT_AMBULATORY_CARE_PROVIDER_SITE_OTHER): Payer: 59 | Admitting: Internal Medicine

## 2018-04-20 DIAGNOSIS — J3089 Other allergic rhinitis: Secondary | ICD-10-CM

## 2018-04-20 NOTE — Patient Instructions (Signed)
Patient received  0.05cc TGW in left arm sq 0.05cc D-MDM in right arm sq No reaction, pt tolerated well

## 2018-04-20 NOTE — Progress Notes (Signed)
Allergy injections per CMA

## 2018-06-12 ENCOUNTER — Encounter: Payer: Self-pay | Admitting: Internal Medicine

## 2018-06-12 ENCOUNTER — Ambulatory Visit (INDEPENDENT_AMBULATORY_CARE_PROVIDER_SITE_OTHER): Payer: 59 | Admitting: Internal Medicine

## 2018-06-12 DIAGNOSIS — J3089 Other allergic rhinitis: Secondary | ICD-10-CM

## 2018-06-12 NOTE — Patient Instructions (Signed)
Patient received  0.05cc TGW in left arm sq 0.05cc D-MDM in right arm sq No reaction, pt tolerated well

## 2018-06-12 NOTE — Progress Notes (Signed)
Allergy vaccine given by CMA

## 2018-06-14 DIAGNOSIS — J301 Allergic rhinitis due to pollen: Secondary | ICD-10-CM | POA: Diagnosis not present

## 2018-06-15 DIAGNOSIS — J3089 Other allergic rhinitis: Secondary | ICD-10-CM | POA: Diagnosis not present

## 2018-07-03 ENCOUNTER — Ambulatory Visit (INDEPENDENT_AMBULATORY_CARE_PROVIDER_SITE_OTHER): Payer: 59 | Admitting: Internal Medicine

## 2018-07-03 DIAGNOSIS — J3089 Other allergic rhinitis: Secondary | ICD-10-CM | POA: Diagnosis not present

## 2018-07-03 NOTE — Progress Notes (Signed)
Allergy injections given per CMA today

## 2018-07-03 NOTE — Patient Instructions (Signed)
Patient received  0.01cc TGW in left arm sq 0.01cc D-MDM in right arm sq No reaction, pt tolerated well

## 2018-07-24 ENCOUNTER — Ambulatory Visit (INDEPENDENT_AMBULATORY_CARE_PROVIDER_SITE_OTHER): Payer: 59 | Admitting: Internal Medicine

## 2018-07-24 ENCOUNTER — Encounter: Payer: Self-pay | Admitting: Internal Medicine

## 2018-07-24 DIAGNOSIS — J3089 Other allergic rhinitis: Secondary | ICD-10-CM

## 2018-07-24 NOTE — Patient Instructions (Addendum)
Patient received  0.02cc TGW in left arm sq 0.02cc D-MDM in right arm sq No reaction, pt tolerated well

## 2018-07-24 NOTE — Progress Notes (Signed)
Allergy injections given per CMA 

## 2018-08-03 ENCOUNTER — Other Ambulatory Visit: Payer: 59 | Admitting: Internal Medicine

## 2018-08-03 DIAGNOSIS — K5790 Diverticulosis of intestine, part unspecified, without perforation or abscess without bleeding: Secondary | ICD-10-CM | POA: Diagnosis not present

## 2018-08-03 DIAGNOSIS — Z0000 Encounter for general adult medical examination without abnormal findings: Secondary | ICD-10-CM

## 2018-08-03 DIAGNOSIS — I10 Essential (primary) hypertension: Secondary | ICD-10-CM | POA: Diagnosis not present

## 2018-08-03 DIAGNOSIS — J3089 Other allergic rhinitis: Secondary | ICD-10-CM | POA: Diagnosis not present

## 2018-08-04 LAB — COMPLETE METABOLIC PANEL WITH GFR
AG RATIO: 1.8 (calc) (ref 1.0–2.5)
ALT: 17 U/L (ref 6–29)
AST: 18 U/L (ref 10–35)
Albumin: 4.4 g/dL (ref 3.6–5.1)
Alkaline phosphatase (APISO): 78 U/L (ref 37–153)
BUN: 18 mg/dL (ref 7–25)
CO2: 27 mmol/L (ref 20–32)
Calcium: 9.5 mg/dL (ref 8.6–10.4)
Chloride: 105 mmol/L (ref 98–110)
Creat: 0.8 mg/dL (ref 0.50–1.05)
GFR, Est African American: 94 mL/min/{1.73_m2} (ref >=60)
GFR, Est Non African American: 81 mL/min/{1.73_m2} (ref >=60)
GLOBULIN: 2.5 g/dL (ref 1.9–3.7)
Glucose, Bld: 79 mg/dL (ref 65–99)
POTASSIUM: 4.7 mmol/L (ref 3.5–5.3)
SODIUM: 139 mmol/L (ref 135–146)
Total Bilirubin: 0.7 mg/dL (ref 0.2–1.2)
Total Protein: 6.9 g/dL (ref 6.1–8.1)

## 2018-08-04 LAB — CBC WITH DIFFERENTIAL/PLATELET
Absolute Monocytes: 369 cells/uL (ref 200–950)
BASOS ABS: 61 {cells}/uL (ref 0–200)
Basophils Relative: 1.1 %
EOS PCT: 3.3 %
Eosinophils Absolute: 182 cells/uL (ref 15–500)
HEMATOCRIT: 38.9 % (ref 35.0–45.0)
Hemoglobin: 13.2 g/dL (ref 11.7–15.5)
LYMPHS ABS: 1546 {cells}/uL (ref 850–3900)
MCH: 30.5 pg (ref 27.0–33.0)
MCHC: 33.9 g/dL (ref 32.0–36.0)
MCV: 89.8 fL (ref 80.0–100.0)
MPV: 9.6 fL (ref 7.5–12.5)
Monocytes Relative: 6.7 %
NEUTROS PCT: 60.8 %
Neutro Abs: 3344 cells/uL (ref 1500–7800)
PLATELETS: 321 10*3/uL (ref 140–400)
RBC: 4.33 10*6/uL (ref 3.80–5.10)
RDW: 12.3 % (ref 11.0–15.0)
TOTAL LYMPHOCYTE: 28.1 %
WBC: 5.5 10*3/uL (ref 3.8–10.8)

## 2018-08-04 LAB — TSH: TSH: 1.35 mIU/L (ref 0.40–4.50)

## 2018-08-04 LAB — LIPID PANEL
CHOLESTEROL: 186 mg/dL (ref <200)
HDL: 96 mg/dL (ref >=50)
LDL Cholesterol (Calc): 77 mg/dL (calc)
Non-HDL Cholesterol (Calc): 90 mg/dL (calc) (ref <130)
Total CHOL/HDL Ratio: 1.9 (calc) (ref <5.0)
Triglycerides: 53 mg/dL (ref <150)

## 2018-08-04 LAB — VITAMIN D 25 HYDROXY (VIT D DEFICIENCY, FRACTURES): Vit D, 25-Hydroxy: 42 ng/mL (ref 30–100)

## 2018-08-07 ENCOUNTER — Ambulatory Visit: Payer: 59 | Admitting: Internal Medicine

## 2018-08-07 ENCOUNTER — Encounter: Payer: 59 | Admitting: Internal Medicine

## 2018-08-14 ENCOUNTER — Ambulatory Visit: Payer: 59 | Admitting: Internal Medicine

## 2018-08-15 ENCOUNTER — Other Ambulatory Visit: Payer: Self-pay | Admitting: Internal Medicine

## 2018-08-19 IMAGING — CT CT ABD-PELV W/ CM
1 of 3 series · 14 of 32 positions shown, 19 images · IV contrast (iopamidol)
Comparison: None.

CLINICAL DATA: Left lower quadrant abdominal pain. Fever. Diarrhea.

EXAM:
CT ABDOMEN AND PELVIS WITH CONTRAST
TECHNIQUE: Multidetector CT imaging of the abdomen and pelvis was performed
using the standard protocol following bolus administration of
intravenous contrast.
CONTRAST:  100mL [id number] IOPAMIDOL ([id number]) INJECTION 61%

[Series 2: abd/pelvis w/cm · axial · 0.71mm/px · z∈[-424,-24]mm · 14 of 90 slices shown, 19 images]
[im 5/90  soft-tissue]
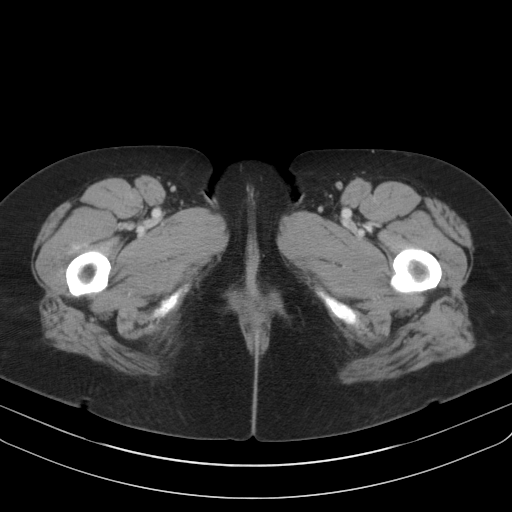
[im 5/90  bone]
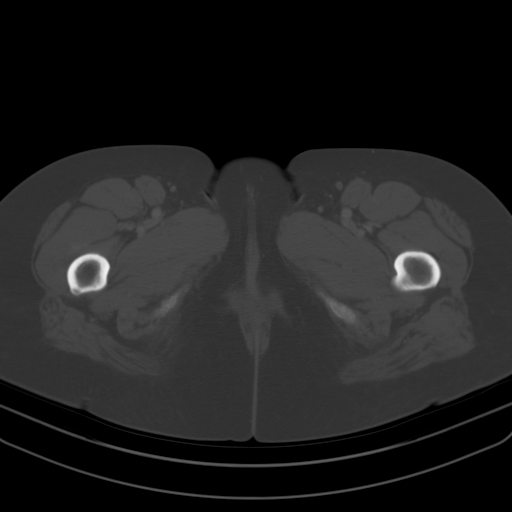
[im 15/90  soft-tissue]
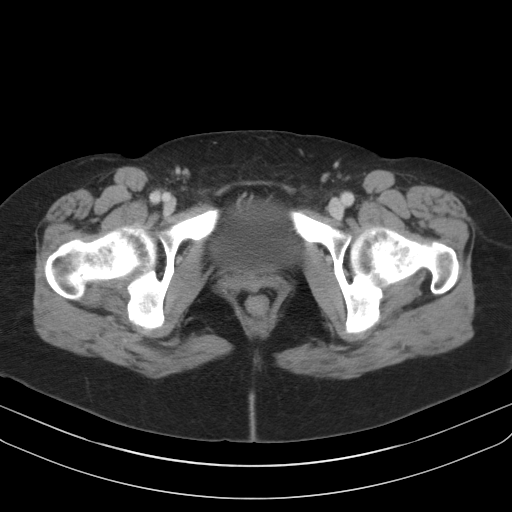
[im 19/90  soft-tissue]
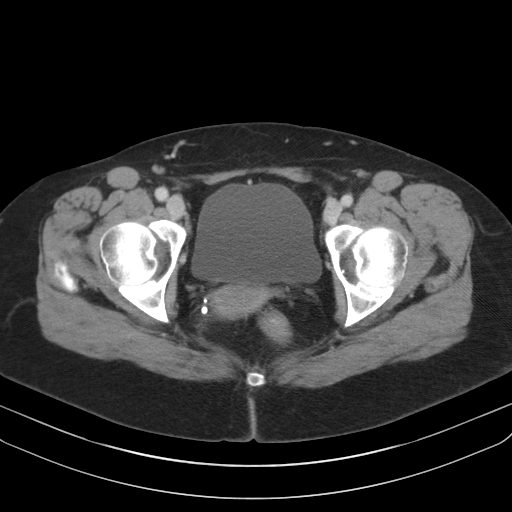
[im 24/90  soft-tissue]
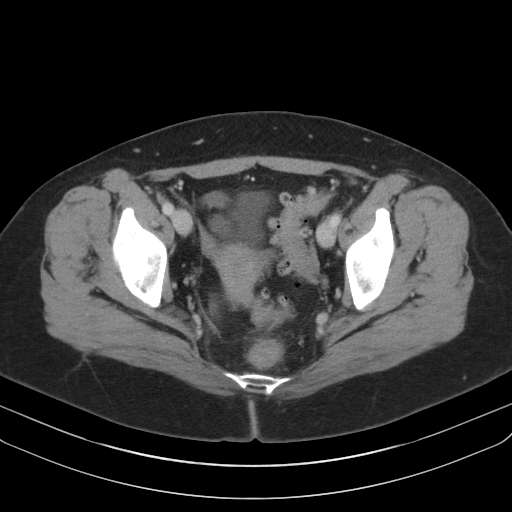
[im 33/90  soft-tissue]
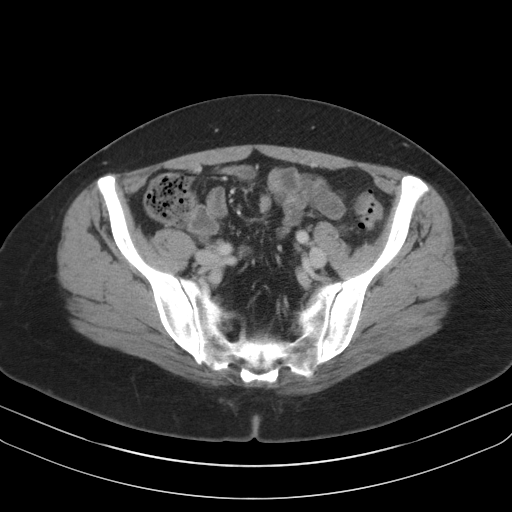
[im 38/90  soft-tissue]
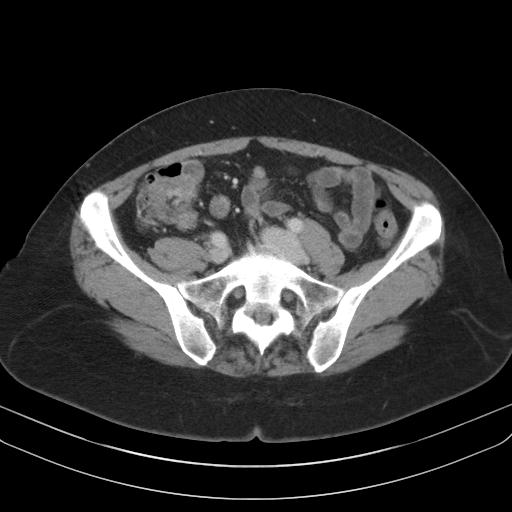
[im 47/90  soft-tissue]
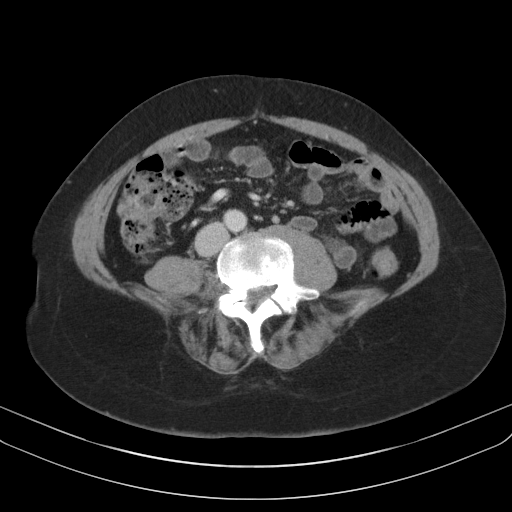
[im 52/90  soft-tissue]
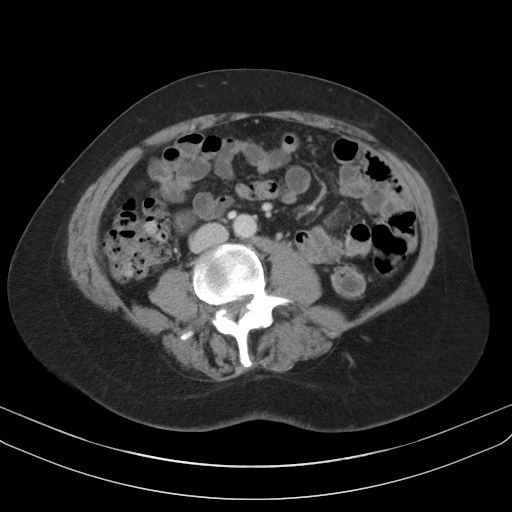
[im 57/90  soft-tissue]
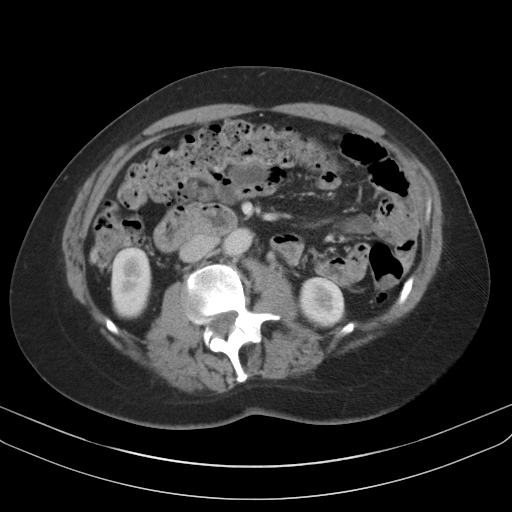
[im 57/90  bone]
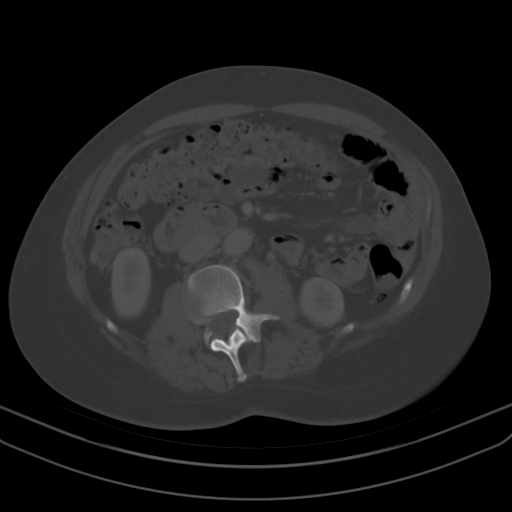
[im 66/90  soft-tissue]
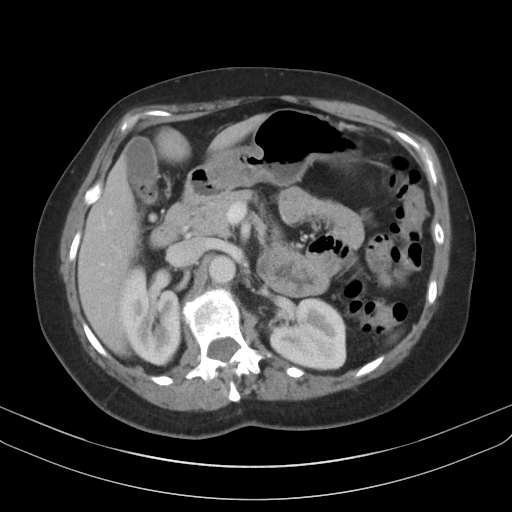
[im 71/90  soft-tissue]
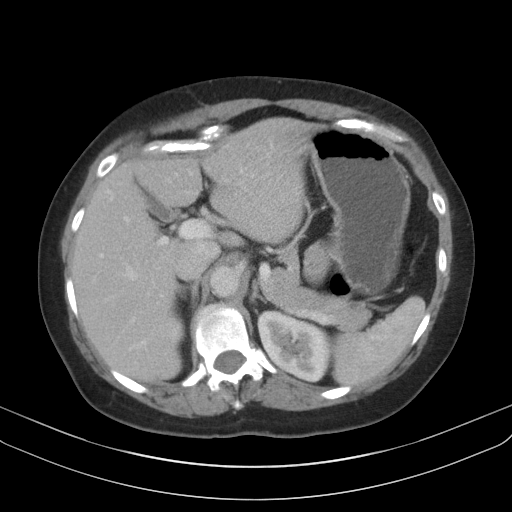
[im 71/90  lung]
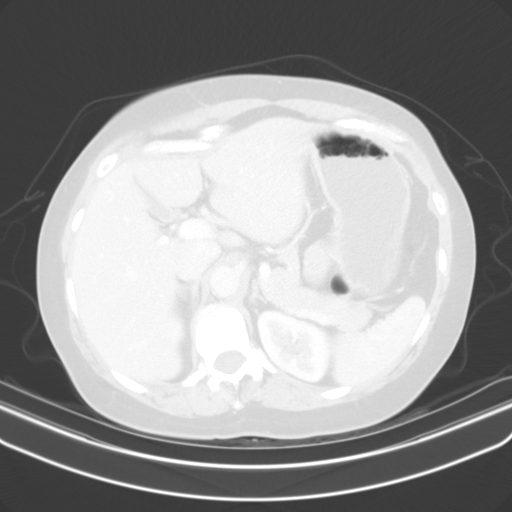
[im 75/90  soft-tissue]
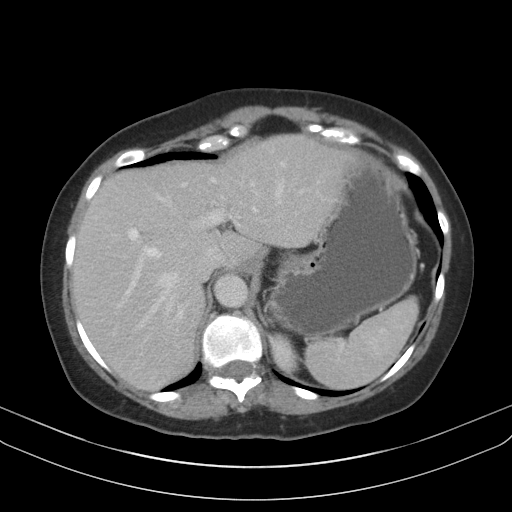
[im 75/90  lung]
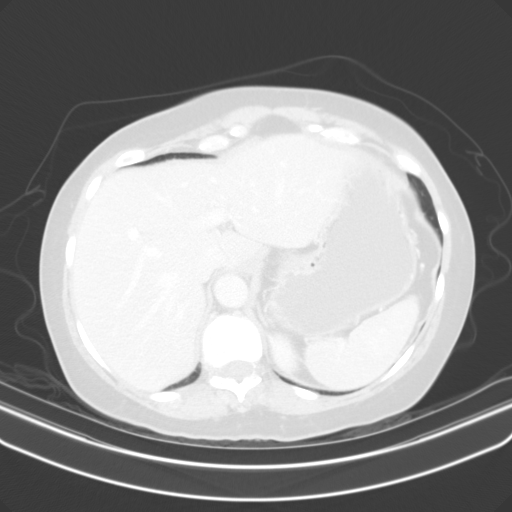
[im 80/90  lung]
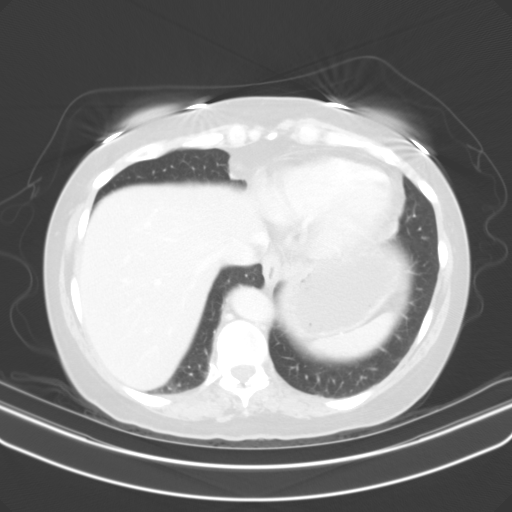
[im 85/90  soft-tissue]
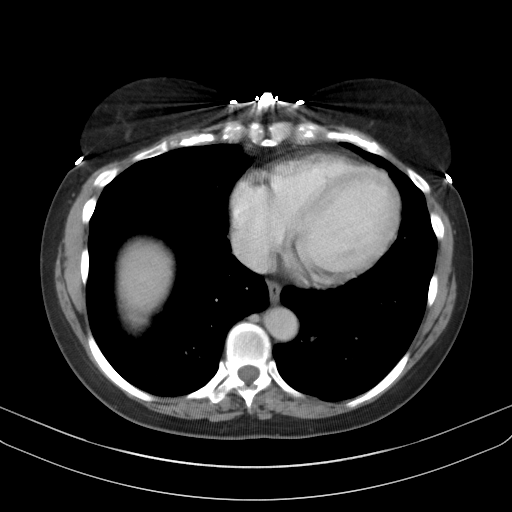
[im 85/90  lung]
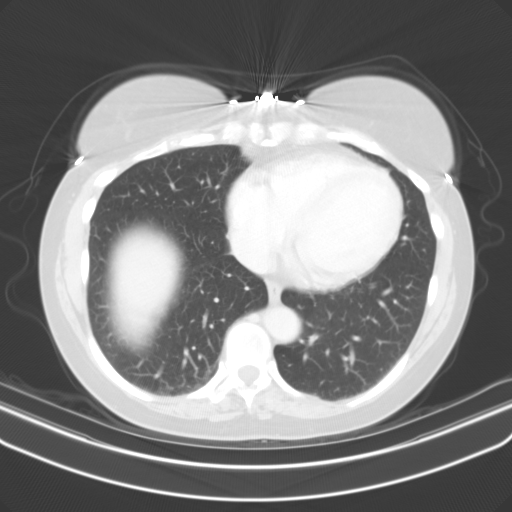

[14 of 32 positions shown; findings below may reference images not displayed]

FINDINGS: Lower chest: Normal.

Hepatobiliary: No focal liver abnormality is seen. No gallstones,
gallbladder wall thickening, or biliary dilatation.

Pancreas: Unremarkable. No pancreatic ductal dilatation or
surrounding inflammatory changes.

Spleen: Normal in size without focal abnormality.

Adrenals/Urinary Tract: Adrenal glands are unremarkable. Kidneys are
normal, without renal calculi, focal lesion, or hydronephrosis.
Bladder is unremarkable.

Stomach/Bowel: There is extensive diverticulosis throughout the
entire colon. There is focal pericolonic inflammation in the distal
sigmoid colon best seen on images 63 through 68 of series 2. No
phlegmon or abscess or free fluid. Remainder of the bowel appears
normal including the terminal ileum appendix.

Vascular/Lymphatic: No significant vascular findings are present. No
enlarged abdominal or pelvic lymph nodes.

Reproductive: Uterus and bilateral adnexa are unremarkable.

Other: No abdominal wall hernia or abnormality. No abdominopelvic
ascites.

Musculoskeletal: Moderate lumbar scoliosis with convexity to the
right centered at L1-2.
IMPRESSION: Focal mild diverticulitis of the distal sigmoid colon just above the
rectum.

Extensive diverticulosis throughout the colon.

## 2018-08-21 ENCOUNTER — Encounter: Payer: Self-pay | Admitting: Internal Medicine

## 2018-08-21 ENCOUNTER — Other Ambulatory Visit: Payer: Self-pay

## 2018-08-21 ENCOUNTER — Ambulatory Visit (INDEPENDENT_AMBULATORY_CARE_PROVIDER_SITE_OTHER): Payer: 59 | Admitting: Internal Medicine

## 2018-08-21 VITALS — BP 120/90 | HR 77 | Temp 98.7°F | Ht 63.75 in | Wt 148.0 lb

## 2018-08-21 DIAGNOSIS — I10 Essential (primary) hypertension: Secondary | ICD-10-CM | POA: Diagnosis not present

## 2018-08-21 DIAGNOSIS — Z0000 Encounter for general adult medical examination without abnormal findings: Secondary | ICD-10-CM

## 2018-08-21 DIAGNOSIS — Z91012 Allergy to eggs: Secondary | ICD-10-CM

## 2018-08-21 DIAGNOSIS — Z8719 Personal history of other diseases of the digestive system: Secondary | ICD-10-CM | POA: Diagnosis not present

## 2018-08-21 DIAGNOSIS — J3089 Other allergic rhinitis: Secondary | ICD-10-CM

## 2018-08-21 LAB — POCT URINALYSIS DIPSTICK
Appearance: NEGATIVE
Bilirubin, UA: NEGATIVE
Blood, UA: NEGATIVE
Glucose, UA: NEGATIVE
Ketones, UA: NEGATIVE
LEUKOCYTES UA: NEGATIVE
Nitrite, UA: NEGATIVE
Odor: NEGATIVE
Protein, UA: NEGATIVE
Spec Grav, UA: 1.01 (ref 1.010–1.025)
Urobilinogen, UA: 0.2 E.U./dL
pH, UA: 6 (ref 5.0–8.0)

## 2018-08-21 NOTE — Progress Notes (Signed)
Subjective:    Patient ID: Ann Chambers, female    DOB: [birth year], 58 y.o.   MRN: [id number]  HPI 58 year old Female for health maintenance exam and evaluation of medical issues. Hx well controlled HTN.  Had calcium score of 0 on Cardiac CT 2018. Works out. Drinks red wine. Dexa scan 2015 was normal. Will order another one for this year. Mammogram was normal April 2019.  No new complaints.  Hx allergic rhinitis and receives immunotherapy.  Had a bout of diverticulitis April 2018.  Had colonoscopy by Dr. Cristina Gong after that which was benign with 10-year follow-up recommended.  Does not take flu vaccine due to history of egg allergy although reaction is only nausea.  She had the flu once only as a child.  Says it was the [place] flu.  History of migraine headaches.  History of scoliosis.  Receives massage therapy for issues with back pain.  In June 2016 she saw Dr. Tonia Brooms for annual skin checkup.  There was an area in the right lateral lower thigh which was thought to be a lipoma which was removed.  Pathology showed adipose tissue.  No atypia was observed.  No known drug allergies.  In 2015 she had some chest pain and EKG was normal.  Had appendectomy at [facility] in 1996.  Right shoulder surgery October 2005 and another right shoulder surgery in 2002.  Patient was seen in the emergency department with acute abdominal pain December 1997.  CT showed thickening of the midportion of the ascending colon.  Proceeded with colonoscopy in 1998 which was normal.  She had another colonoscopy in 2011 which was normal.  History of lactose intolerance and irritable bowel syndrome.  Social history: Married with no children.  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  Husband has a Psychiatric nurse.  Does not smoke.  Social alcohol consumption.  Family history: Father with history of hypertension, MI, CABG, died with cerebral hemorrhage.  Mother  with history of cardiomyopathy and congestive heart failure died at 89 years of age.  One brother in good health.  Sister who has hypertension has been diagnosed with lung cancer.  Both parents with history of hypertension.  Had genetic testing at the cancer center in October 2019.  She was heterozygous for MUTYH.  Was thought to have moderate increased risk for colon polyps but has had negative colonoscopies.  No other hereditary predisposition to cancer was identified with her consultation.  Review of Systems no new complaints except for some musculoskeletal pain left axillary area.  Thinks this is from working out.  Labs reviewed and are entirely within normal limits.     Objective:   Physical Exam Vitals signs reviewed.  Constitutional:      General: She is not in acute distress.    Appearance: Normal appearance.  HENT:     Head: Normocephalic and atraumatic.     Right Ear: Tympanic membrane and external ear normal.     Left Ear: Tympanic membrane and external ear normal.     Nose: Nose normal. No congestion.     Mouth/Throat:     Mouth: Mucous membranes are moist.     Pharynx: Oropharynx is clear. No oropharyngeal exudate or posterior oropharyngeal erythema.  Eyes:     General: No scleral icterus.       Right eye: No discharge.        Left eye: No discharge.     Extraocular Movements:  Extraocular movements intact.     Pupils: Pupils are equal, round, and reactive to light.  Neck:     Musculoskeletal: Neck supple. No neck rigidity.     Vascular: No carotid bruit.     Comments: No thyromegaly.  Small anterior cervical nodes bilaterally.  Breasts: There is a nickel sized soft but tender lesion in the left lateral breast at approximately 4:00 which she will point out to mammographer at her mammogram in the near future.  This feels a bit like a lipoma and she does have a history of lipoma. Cardiovascular:     Rate and Rhythm: Normal rate and regular rhythm.     Pulses: Normal  pulses.     Heart sounds: Normal heart sounds. No murmur.  Pulmonary:     Effort: Pulmonary effort is normal.     Breath sounds: Normal breath sounds.  Abdominal:     General: Bowel sounds are normal. There is no distension.     Palpations: Abdomen is soft. There is no mass.     Tenderness: There is no abdominal tenderness. There is no guarding or rebound.     Hernia: No hernia is present.  Genitourinary:    Comments: Deferred to GYN Musculoskeletal:     Right lower leg: No edema.     Left lower leg: No edema.  Skin:    General: Skin is warm and dry.     Findings: No rash.  Neurological:     General: No focal deficit present.     Mental Status: She is alert and oriented to person, place, and time.     Cranial Nerves: No cranial nerve deficit.     Sensory: No sensory deficit.     Motor: No weakness.     Coordination: Coordination normal.     Gait: Gait normal.  Psychiatric:        Mood and Affect: Mood normal.        Behavior: Behavior normal.        Thought Content: Thought content normal.        Judgment: Judgment normal.           Assessment & Plan:  Essential hypertension-stable on current regimen  History of diverticulitis-no recent attacks  Allergic rhinitis on immunotherapy she is allergic to pollen and receives immunotherapy for trees, grasses, weeds as well as dust, dust mite and molds.  Immunotherapy given today x2  History of egg allergy-does not take flu vaccine  Family history of lung cancer in sister and has had genetic testing herself at cancer center here in [place].  See above.  ?  Lipoma left lateral breast-patient is to point this out at the time of her mammogram in the near future.  It is tender and about the size of a nickel

## 2018-08-21 NOTE — Patient Instructions (Signed)
Continue immunotherapy for allergies.  Continue same antihypertensive medications.  Return in 1 year or as needed.  Have mammogram in the near future and point out nickel sized area left lateral breast to mammographer.

## 2018-08-28 ENCOUNTER — Encounter: Payer: Self-pay | Admitting: Internal Medicine

## 2018-08-28 DIAGNOSIS — D1779 Benign lipomatous neoplasm of other sites: Secondary | ICD-10-CM | POA: Diagnosis not present

## 2018-08-28 DIAGNOSIS — N6321 Unspecified lump in the left breast, upper outer quadrant: Secondary | ICD-10-CM | POA: Diagnosis not present

## 2018-08-28 DIAGNOSIS — N6323 Unspecified lump in the left breast, lower outer quadrant: Secondary | ICD-10-CM | POA: Diagnosis not present

## 2018-09-11 ENCOUNTER — Encounter: Payer: Self-pay | Admitting: Internal Medicine

## 2018-09-11 ENCOUNTER — Ambulatory Visit (INDEPENDENT_AMBULATORY_CARE_PROVIDER_SITE_OTHER): Payer: 59 | Admitting: Internal Medicine

## 2018-09-11 ENCOUNTER — Other Ambulatory Visit: Payer: Self-pay

## 2018-09-11 VITALS — BP 130/80 | HR 80 | Temp 98.3°F

## 2018-09-11 DIAGNOSIS — Z9109 Other allergy status, other than to drugs and biological substances: Secondary | ICD-10-CM

## 2018-09-11 NOTE — Progress Notes (Signed)
Here for allergy injections given by CMA

## 2018-09-11 NOTE — Patient Instructions (Signed)
Patient received  0.03cc TGW in left arm sq 0.03cc D-MDM in right arm sq No reaction, pt tolerated well

## 2018-10-02 ENCOUNTER — Ambulatory Visit (INDEPENDENT_AMBULATORY_CARE_PROVIDER_SITE_OTHER): Payer: 59 | Admitting: Internal Medicine

## 2018-10-02 ENCOUNTER — Encounter: Payer: Self-pay | Admitting: Internal Medicine

## 2018-10-02 ENCOUNTER — Other Ambulatory Visit: Payer: Self-pay

## 2018-10-02 DIAGNOSIS — J3089 Other allergic rhinitis: Secondary | ICD-10-CM

## 2018-10-02 NOTE — Progress Notes (Signed)
Allergy injections given per CMA

## 2018-10-02 NOTE — Patient Instructions (Signed)
Allergy injections per CMA

## 2018-10-23 ENCOUNTER — Encounter: Payer: Self-pay | Admitting: Internal Medicine

## 2018-10-23 ENCOUNTER — Other Ambulatory Visit: Payer: Self-pay

## 2018-10-23 ENCOUNTER — Ambulatory Visit (INDEPENDENT_AMBULATORY_CARE_PROVIDER_SITE_OTHER): Payer: 59 | Admitting: Internal Medicine

## 2018-10-23 DIAGNOSIS — J3089 Other allergic rhinitis: Secondary | ICD-10-CM | POA: Diagnosis not present

## 2018-10-23 NOTE — Progress Notes (Signed)
Allergy injections given by CMA

## 2018-10-23 NOTE — Patient Instructions (Signed)
Patient received  0.03cc TGW in left arm sq 0.03cc D-MDM in right arm sq No reaction, pt tolerated well

## 2018-11-16 ENCOUNTER — Ambulatory Visit (INDEPENDENT_AMBULATORY_CARE_PROVIDER_SITE_OTHER): Payer: 59 | Admitting: Internal Medicine

## 2018-11-16 ENCOUNTER — Other Ambulatory Visit: Payer: Self-pay

## 2018-11-16 DIAGNOSIS — Z9109 Other allergy status, other than to drugs and biological substances: Secondary | ICD-10-CM

## 2018-11-16 NOTE — Patient Instructions (Addendum)
Allergy injections given x 2 without reaction by CMA

## 2018-11-16 NOTE — Progress Notes (Signed)
Pre visit review using our clinic review tool, if applicable. No additional management support is needed unless otherwise documented below in the visit note.  Patient here today for allergy injection. 0.22mL t-g-w injected into patients left arm SQ. 0.68mL D-MDM injected into patients right arm SQ. Patient tolerated well no reaction after 10 minutes.   Patient seen in exam room. No issues after injections. MJB

## 2018-12-07 ENCOUNTER — Encounter: Payer: Self-pay | Admitting: Internal Medicine

## 2018-12-07 ENCOUNTER — Other Ambulatory Visit: Payer: Self-pay

## 2018-12-07 ENCOUNTER — Ambulatory Visit (INDEPENDENT_AMBULATORY_CARE_PROVIDER_SITE_OTHER): Payer: 59 | Admitting: Internal Medicine

## 2018-12-07 VITALS — BP 120/80 | HR 78 | Temp 98.4°F | Ht 63.75 in | Wt 148.0 lb

## 2018-12-07 DIAGNOSIS — J3089 Other allergic rhinitis: Secondary | ICD-10-CM | POA: Diagnosis not present

## 2018-12-07 NOTE — Patient Instructions (Addendum)
Patient received  0.05cc TGW in left arm sq 0.05cc D-MDM in right arm sq No reaction, pt tolerated well  RTC 2 weeks

## 2018-12-10 NOTE — Progress Notes (Signed)
Allergy injections per CMA

## 2018-12-13 ENCOUNTER — Other Ambulatory Visit: Payer: Self-pay | Admitting: Internal Medicine

## 2018-12-28 ENCOUNTER — Ambulatory Visit (INDEPENDENT_AMBULATORY_CARE_PROVIDER_SITE_OTHER): Payer: 59 | Admitting: Internal Medicine

## 2018-12-28 ENCOUNTER — Other Ambulatory Visit: Payer: Self-pay

## 2018-12-28 VITALS — BP 120/88 | HR 78 | Temp 98.4°F

## 2018-12-28 DIAGNOSIS — J3089 Other allergic rhinitis: Secondary | ICD-10-CM

## 2018-12-28 NOTE — Progress Notes (Signed)
Allergy injections per CMA

## 2018-12-28 NOTE — Patient Instructions (Signed)
Patient received  0.05cc TGW in left arm sq 0.05cc D-MDM in right arm sq No reaction, pt tolerated well  RTC 2 weeks

## 2019-01-15 ENCOUNTER — Encounter: Payer: Self-pay | Admitting: Internal Medicine

## 2019-01-15 ENCOUNTER — Ambulatory Visit (INDEPENDENT_AMBULATORY_CARE_PROVIDER_SITE_OTHER): Payer: 59 | Admitting: Internal Medicine

## 2019-01-15 ENCOUNTER — Other Ambulatory Visit: Payer: Self-pay

## 2019-01-15 VITALS — BP 110/78 | HR 80 | Temp 98.2°F | Ht 63.75 in | Wt 148.0 lb

## 2019-01-15 DIAGNOSIS — J3089 Other allergic rhinitis: Secondary | ICD-10-CM

## 2019-01-15 NOTE — Patient Instructions (Signed)
Allergy injections given by CMA

## 2019-01-15 NOTE — Progress Notes (Signed)
   Subjective:    Patient ID: Ann Chambers, female    DOB: 1961-01-03, 58 y.o.   MRN: [id number]  HPI Allergy injections given by CMA  0.05ccTGW left arm and 0.05cc D-MDM right arm  Review of Systems     Objective:   Physical Exam        Assessment & Plan:

## 2019-02-05 ENCOUNTER — Other Ambulatory Visit: Payer: Self-pay

## 2019-02-05 ENCOUNTER — Encounter: Payer: Self-pay | Admitting: Internal Medicine

## 2019-02-05 ENCOUNTER — Ambulatory Visit (INDEPENDENT_AMBULATORY_CARE_PROVIDER_SITE_OTHER): Payer: 59 | Admitting: Internal Medicine

## 2019-02-05 DIAGNOSIS — J3089 Other allergic rhinitis: Secondary | ICD-10-CM

## 2019-02-05 NOTE — Progress Notes (Signed)
Immunotherapy for allergic rhinitis per CMA

## 2019-02-11 NOTE — Patient Instructions (Addendum)
Patient received  0.05cc TGW in left arm sq 0.05cc D-MDM in right arm sq No reaction, pt tolerated well

## 2019-02-26 ENCOUNTER — Other Ambulatory Visit: Payer: Self-pay

## 2019-02-26 ENCOUNTER — Ambulatory Visit (INDEPENDENT_AMBULATORY_CARE_PROVIDER_SITE_OTHER): Payer: 59 | Admitting: Internal Medicine

## 2019-02-26 DIAGNOSIS — J3089 Other allergic rhinitis: Secondary | ICD-10-CM

## 2019-02-26 NOTE — Progress Notes (Signed)
Allergy injections per CMA

## 2019-02-26 NOTE — Patient Instructions (Signed)
Patient received  0.05cc TGW in left arm sq 0.05cc D-MDM in right arm sq No reaction, pt tolerated well  RTC 2 weeks 

## 2019-03-12 ENCOUNTER — Ambulatory Visit: Payer: 59 | Admitting: Internal Medicine

## 2019-03-20 ENCOUNTER — Other Ambulatory Visit: Payer: Self-pay

## 2019-03-20 ENCOUNTER — Ambulatory Visit (INDEPENDENT_AMBULATORY_CARE_PROVIDER_SITE_OTHER): Payer: 59 | Admitting: Internal Medicine

## 2019-03-20 VITALS — BP 130/88 | HR 88 | Temp 98.3°F | Ht 67.0 in | Wt 153.0 lb

## 2019-03-20 DIAGNOSIS — J3089 Other allergic rhinitis: Secondary | ICD-10-CM | POA: Diagnosis not present

## 2019-03-20 NOTE — Patient Instructions (Signed)
Patient received  0.05cc TGW in left arm sq 0.05cc D-MDM in right arm sq No reaction, pt tolerated well

## 2019-03-24 NOTE — Progress Notes (Signed)
Allergy injections per CMA

## 2019-04-08 ENCOUNTER — Other Ambulatory Visit: Payer: Self-pay

## 2019-04-08 ENCOUNTER — Encounter: Payer: Self-pay | Admitting: Internal Medicine

## 2019-04-08 ENCOUNTER — Ambulatory Visit (INDEPENDENT_AMBULATORY_CARE_PROVIDER_SITE_OTHER): Payer: 59 | Admitting: Internal Medicine

## 2019-04-08 VITALS — BP 120/80 | HR 77 | Temp 98.1°F | Ht 63.75 in | Wt 148.0 lb

## 2019-04-08 DIAGNOSIS — J3089 Other allergic rhinitis: Secondary | ICD-10-CM | POA: Diagnosis not present

## 2019-04-08 DIAGNOSIS — Z298 Encounter for other specified prophylactic measures: Secondary | ICD-10-CM

## 2019-04-08 NOTE — Patient Instructions (Signed)
Patient received  0.05cc TGW in left arm sq 0.05cc D-MDM in right arm sq No reaction, pt tolerated well

## 2019-04-08 NOTE — Progress Notes (Signed)
Allergy vaccines given per CMA

## 2019-04-11 ENCOUNTER — Ambulatory Visit: Payer: 59 | Admitting: Internal Medicine

## 2019-04-24 ENCOUNTER — Encounter: Payer: Self-pay | Admitting: Internal Medicine

## 2019-04-24 ENCOUNTER — Ambulatory Visit (INDEPENDENT_AMBULATORY_CARE_PROVIDER_SITE_OTHER): Payer: 59 | Admitting: Internal Medicine

## 2019-04-24 ENCOUNTER — Other Ambulatory Visit: Payer: Self-pay

## 2019-04-24 DIAGNOSIS — J3089 Other allergic rhinitis: Secondary | ICD-10-CM | POA: Diagnosis not present

## 2019-04-24 NOTE — Patient Instructions (Signed)
Patient received  0.05cc TGW in left arm sq 0.05cc D-MDM in right arm sq No reaction, pt tolerated w

## 2019-04-24 NOTE — Progress Notes (Signed)
Allergy injections given per CMA

## 2019-05-03 ENCOUNTER — Other Ambulatory Visit: Payer: Self-pay

## 2019-05-03 ENCOUNTER — Encounter: Payer: Self-pay | Admitting: Internal Medicine

## 2019-05-03 ENCOUNTER — Ambulatory Visit (INDEPENDENT_AMBULATORY_CARE_PROVIDER_SITE_OTHER): Payer: 59 | Admitting: Internal Medicine

## 2019-05-03 VITALS — BP 120/80 | HR 86 | Temp 98.0°F | Ht 63.75 in | Wt 148.0 lb

## 2019-05-03 DIAGNOSIS — Z23 Encounter for immunization: Secondary | ICD-10-CM

## 2019-05-03 NOTE — Progress Notes (Signed)
Took Benadryl prior to coming. Has epipen on hand in case of reaction. Flu vaccine given by CMA

## 2019-05-03 NOTE — Patient Instructions (Signed)
Patient received a flu vaccine IM L deltoid, AV, CMA  

## 2019-05-20 ENCOUNTER — Encounter: Payer: Self-pay | Admitting: Internal Medicine

## 2019-05-20 ENCOUNTER — Ambulatory Visit (INDEPENDENT_AMBULATORY_CARE_PROVIDER_SITE_OTHER): Payer: 59 | Admitting: Internal Medicine

## 2019-05-20 ENCOUNTER — Other Ambulatory Visit: Payer: Self-pay

## 2019-05-20 DIAGNOSIS — J3089 Other allergic rhinitis: Secondary | ICD-10-CM

## 2019-05-20 NOTE — Progress Notes (Signed)
Allergy injections given by CMA

## 2019-05-20 NOTE — Patient Instructions (Signed)
Patient received  0.05cc TGW in left arm sq 0.05cc D-MDM in right arm sq No reaction, pt tolerated well

## 2019-06-10 ENCOUNTER — Other Ambulatory Visit: Payer: Self-pay | Admitting: Internal Medicine

## 2019-06-11 ENCOUNTER — Ambulatory Visit: Payer: 59 | Admitting: Internal Medicine

## 2019-06-14 ENCOUNTER — Ambulatory Visit (INDEPENDENT_AMBULATORY_CARE_PROVIDER_SITE_OTHER): Payer: 59 | Admitting: Internal Medicine

## 2019-06-14 ENCOUNTER — Other Ambulatory Visit: Payer: Self-pay

## 2019-06-14 VITALS — BP 100/60 | HR 78 | Temp 98.0°F | Ht 63.75 in | Wt 148.0 lb

## 2019-06-14 DIAGNOSIS — J3089 Other allergic rhinitis: Secondary | ICD-10-CM

## 2019-06-14 DIAGNOSIS — Z298 Encounter for other specified prophylactic measures: Secondary | ICD-10-CM

## 2019-06-14 DIAGNOSIS — I10 Essential (primary) hypertension: Secondary | ICD-10-CM

## 2019-06-14 NOTE — Progress Notes (Signed)
Allergy injections given per CMA. VS reviewed. Pt seen briefly by M.D. Tolerated injections well without side effects. MJB,MD

## 2019-06-14 NOTE — Patient Instructions (Addendum)
RTC 2 weeks. BP stable.

## 2019-06-28 ENCOUNTER — Encounter: Payer: Self-pay | Admitting: Internal Medicine

## 2019-06-28 ENCOUNTER — Ambulatory Visit (INDEPENDENT_AMBULATORY_CARE_PROVIDER_SITE_OTHER): Payer: 59 | Admitting: Internal Medicine

## 2019-06-28 ENCOUNTER — Other Ambulatory Visit: Payer: Self-pay

## 2019-06-28 VITALS — BP 120/80 | HR 84 | Temp 98.0°F | Ht 63.75 in | Wt 148.0 lb

## 2019-06-28 DIAGNOSIS — J3089 Other allergic rhinitis: Secondary | ICD-10-CM | POA: Diagnosis not present

## 2019-06-28 DIAGNOSIS — Z298 Encounter for other specified prophylactic measures: Secondary | ICD-10-CM

## 2019-06-28 NOTE — Progress Notes (Signed)
Allergy injections given by CMA

## 2019-06-28 NOTE — Patient Instructions (Addendum)
Patient received  0.01cc TGW in left arm sq 0.0cc D-MDM in right arm sq No reaction, pt tolerated well  RTC 2 weeks

## 2019-07-04 ENCOUNTER — Ambulatory Visit (INDEPENDENT_AMBULATORY_CARE_PROVIDER_SITE_OTHER): Payer: 59 | Admitting: Internal Medicine

## 2019-07-04 ENCOUNTER — Encounter: Payer: Self-pay | Admitting: Internal Medicine

## 2019-07-04 ENCOUNTER — Other Ambulatory Visit: Payer: Self-pay

## 2019-07-04 DIAGNOSIS — Z298 Encounter for other specified prophylactic measures: Secondary | ICD-10-CM

## 2019-07-04 DIAGNOSIS — J3089 Other allergic rhinitis: Secondary | ICD-10-CM | POA: Diagnosis not present

## 2019-07-04 NOTE — Progress Notes (Signed)
Allergy injections per CMA

## 2019-07-04 NOTE — Patient Instructions (Signed)
Patient received  0.01ccTGW in left arm sq 0.0cc D-MDM in right arm sq No reaction, pt tolerated well

## 2019-07-05 ENCOUNTER — Ambulatory Visit: Payer: 59 | Admitting: Internal Medicine

## 2019-07-09 ENCOUNTER — Other Ambulatory Visit: Payer: Self-pay

## 2019-07-09 ENCOUNTER — Encounter: Payer: Self-pay | Admitting: Internal Medicine

## 2019-07-09 ENCOUNTER — Ambulatory Visit (INDEPENDENT_AMBULATORY_CARE_PROVIDER_SITE_OTHER): Payer: 59 | Admitting: Internal Medicine

## 2019-07-09 VITALS — BP 120/80 | HR 68 | Temp 98.0°F | Ht 63.75 in | Wt 148.0 lb

## 2019-07-09 DIAGNOSIS — J3089 Other allergic rhinitis: Secondary | ICD-10-CM

## 2019-07-09 NOTE — Patient Instructions (Addendum)
0.2ccTGW in left arm sq 0.2cc D-MDM in right arm sq No reaction, pt tolerated well

## 2019-07-09 NOTE — Progress Notes (Signed)
Allergy injections per CMA

## 2019-07-12 ENCOUNTER — Ambulatory Visit: Payer: 59 | Admitting: Internal Medicine

## 2019-07-15 ENCOUNTER — Ambulatory Visit (INDEPENDENT_AMBULATORY_CARE_PROVIDER_SITE_OTHER): Payer: 59 | Admitting: Internal Medicine

## 2019-07-15 ENCOUNTER — Encounter: Payer: Self-pay | Admitting: Internal Medicine

## 2019-07-15 ENCOUNTER — Other Ambulatory Visit: Payer: Self-pay

## 2019-07-15 VITALS — BP 110/80 | HR 64 | Temp 98.0°F

## 2019-07-15 DIAGNOSIS — Z298 Encounter for other specified prophylactic measures: Secondary | ICD-10-CM | POA: Diagnosis not present

## 2019-07-15 DIAGNOSIS — J3089 Other allergic rhinitis: Secondary | ICD-10-CM

## 2019-07-15 NOTE — Progress Notes (Signed)
Allergy injections per CMA

## 2019-07-15 NOTE — Patient Instructions (Signed)
0.2ccTGW in left arm sq 0.2cc D-MDM in right arm sq No reaction, pt tolerated well

## 2019-07-19 ENCOUNTER — Ambulatory Visit: Payer: 59 | Admitting: Internal Medicine

## 2019-07-23 ENCOUNTER — Other Ambulatory Visit: Payer: Self-pay

## 2019-07-23 ENCOUNTER — Ambulatory Visit (INDEPENDENT_AMBULATORY_CARE_PROVIDER_SITE_OTHER): Payer: 59 | Admitting: Internal Medicine

## 2019-07-23 ENCOUNTER — Encounter: Payer: Self-pay | Admitting: Internal Medicine

## 2019-07-23 DIAGNOSIS — Z298 Encounter for other specified prophylactic measures: Secondary | ICD-10-CM | POA: Diagnosis not present

## 2019-07-23 NOTE — Progress Notes (Signed)
Immunotherapy per CMA

## 2019-07-23 NOTE — Patient Instructions (Addendum)
0.3ccTGW in left arm sq 0.3cc D-MDM in right arm sq No reaction, pt tolerated well  RTC March 5

## 2019-07-26 ENCOUNTER — Other Ambulatory Visit: Payer: Self-pay

## 2019-07-26 ENCOUNTER — Encounter: Payer: Self-pay | Admitting: Internal Medicine

## 2019-07-26 ENCOUNTER — Ambulatory Visit: Payer: 59 | Admitting: Internal Medicine

## 2019-07-26 ENCOUNTER — Ambulatory Visit (INDEPENDENT_AMBULATORY_CARE_PROVIDER_SITE_OTHER): Payer: 59 | Admitting: Internal Medicine

## 2019-07-26 DIAGNOSIS — Z298 Encounter for other specified prophylactic measures: Secondary | ICD-10-CM | POA: Diagnosis not present

## 2019-07-26 DIAGNOSIS — J3089 Other allergic rhinitis: Secondary | ICD-10-CM

## 2019-07-26 NOTE — Patient Instructions (Addendum)
0.3ccTGW in left arm sq 0.3cc D-MDM in right arm sq No reaction, pt tolerated well

## 2019-07-26 NOTE — Progress Notes (Signed)
Allergy injections per CMA

## 2019-08-02 ENCOUNTER — Other Ambulatory Visit: Payer: Self-pay

## 2019-08-02 ENCOUNTER — Encounter: Payer: Self-pay | Admitting: Internal Medicine

## 2019-08-02 ENCOUNTER — Ambulatory Visit (INDEPENDENT_AMBULATORY_CARE_PROVIDER_SITE_OTHER): Payer: 59 | Admitting: Internal Medicine

## 2019-08-02 VITALS — BP 130/88 | HR 84 | Temp 98.0°F | Ht 63.75 in | Wt 148.0 lb

## 2019-08-02 DIAGNOSIS — J3089 Other allergic rhinitis: Secondary | ICD-10-CM

## 2019-08-02 NOTE — Patient Instructions (Signed)
0.4ccTGW in left arm sq 0.4cc D-MDM in right arm sq No reaction, pt tolerated well

## 2019-08-02 NOTE — Progress Notes (Signed)
Allergy injections per CMA

## 2019-08-05 ENCOUNTER — Other Ambulatory Visit: Payer: Self-pay

## 2019-08-05 ENCOUNTER — Encounter: Payer: Self-pay | Admitting: Internal Medicine

## 2019-08-05 ENCOUNTER — Ambulatory Visit (INDEPENDENT_AMBULATORY_CARE_PROVIDER_SITE_OTHER): Payer: 59 | Admitting: Internal Medicine

## 2019-08-05 VITALS — BP 130/88 | HR 74 | Temp 98.0°F | Ht 63.75 in | Wt 148.0 lb

## 2019-08-05 DIAGNOSIS — Z298 Encounter for other specified prophylactic measures: Secondary | ICD-10-CM | POA: Diagnosis not present

## 2019-08-05 DIAGNOSIS — J3089 Other allergic rhinitis: Secondary | ICD-10-CM

## 2019-08-05 NOTE — Patient Instructions (Signed)
0.4ccTGW in left arm sq 0.4cc D-MDM in right arm sq No reaction, pt tolerated well

## 2019-08-05 NOTE — Progress Notes (Signed)
Allergy injections per CMA

## 2019-08-09 ENCOUNTER — Other Ambulatory Visit: Payer: Self-pay

## 2019-08-09 ENCOUNTER — Ambulatory Visit (INDEPENDENT_AMBULATORY_CARE_PROVIDER_SITE_OTHER): Payer: 59 | Admitting: Internal Medicine

## 2019-08-09 ENCOUNTER — Encounter: Payer: Self-pay | Admitting: Internal Medicine

## 2019-08-09 VITALS — BP 120/80 | HR 66 | Temp 98.2°F | Ht 63.75 in | Wt 148.0 lb

## 2019-08-09 DIAGNOSIS — J3089 Other allergic rhinitis: Secondary | ICD-10-CM

## 2019-08-09 DIAGNOSIS — Z298 Encounter for other specified prophylactic measures: Secondary | ICD-10-CM

## 2019-08-09 NOTE — Progress Notes (Signed)
Allergy injections per CMA

## 2019-08-09 NOTE — Patient Instructions (Signed)
0.5ccTGW in left arm sq 0.5cc D-MDM in right arm sq No reaction, pt tolerated well   

## 2019-08-12 ENCOUNTER — Other Ambulatory Visit: Payer: Self-pay

## 2019-08-12 ENCOUNTER — Encounter: Payer: Self-pay | Admitting: Internal Medicine

## 2019-08-12 ENCOUNTER — Ambulatory Visit (INDEPENDENT_AMBULATORY_CARE_PROVIDER_SITE_OTHER): Payer: 59 | Admitting: Internal Medicine

## 2019-08-12 VITALS — BP 110/80 | HR 66 | Temp 98.0°F | Ht 63.75 in | Wt 148.0 lb

## 2019-08-12 DIAGNOSIS — Z298 Encounter for other specified prophylactic measures: Secondary | ICD-10-CM

## 2019-08-12 DIAGNOSIS — Z9109 Other allergy status, other than to drugs and biological substances: Secondary | ICD-10-CM

## 2019-08-12 NOTE — Progress Notes (Signed)
Allergy injections per CMA

## 2019-08-13 NOTE — Patient Instructions (Signed)
0.5ccTGW in left arm sq 0.5cc D-MDM in right arm sq No reaction, pt tolerated well   

## 2019-08-15 ENCOUNTER — Other Ambulatory Visit: Payer: Self-pay

## 2019-08-15 ENCOUNTER — Encounter: Payer: Self-pay | Admitting: Internal Medicine

## 2019-08-15 ENCOUNTER — Ambulatory Visit (INDEPENDENT_AMBULATORY_CARE_PROVIDER_SITE_OTHER): Payer: 59 | Admitting: Internal Medicine

## 2019-08-15 VITALS — BP 130/88 | HR 70 | Temp 98.0°F | Ht 63.75 in | Wt 148.0 lb

## 2019-08-15 DIAGNOSIS — J3089 Other allergic rhinitis: Secondary | ICD-10-CM

## 2019-08-15 DIAGNOSIS — Z298 Encounter for other specified prophylactic measures: Secondary | ICD-10-CM | POA: Diagnosis not present

## 2019-08-15 NOTE — Patient Instructions (Signed)
0.5ccTGW in left arm sq 0.5cc D-MDM in right arm sq No reaction, pt tolerated well   

## 2019-08-15 NOTE — Progress Notes (Signed)
Allergy injections per CMA

## 2019-08-16 ENCOUNTER — Ambulatory Visit: Payer: 59 | Admitting: Internal Medicine

## 2019-08-19 ENCOUNTER — Ambulatory Visit: Payer: 59 | Admitting: Internal Medicine

## 2019-08-21 ENCOUNTER — Encounter: Payer: Self-pay | Admitting: Internal Medicine

## 2019-08-22 ENCOUNTER — Ambulatory Visit: Payer: 59 | Admitting: Internal Medicine

## 2019-08-23 ENCOUNTER — Ambulatory Visit: Payer: 59 | Admitting: Internal Medicine

## 2019-08-29 ENCOUNTER — Other Ambulatory Visit: Payer: Self-pay

## 2019-08-29 ENCOUNTER — Ambulatory Visit (INDEPENDENT_AMBULATORY_CARE_PROVIDER_SITE_OTHER): Payer: 59 | Admitting: Internal Medicine

## 2019-08-29 ENCOUNTER — Encounter: Payer: Self-pay | Admitting: Internal Medicine

## 2019-08-29 VITALS — BP 130/88 | HR 74 | Temp 98.0°F | Ht 63.0 in | Wt 153.0 lb

## 2019-08-29 DIAGNOSIS — J3089 Other allergic rhinitis: Secondary | ICD-10-CM

## 2019-08-29 DIAGNOSIS — Z298 Encounter for other specified prophylactic measures: Secondary | ICD-10-CM | POA: Diagnosis not present

## 2019-08-29 NOTE — Progress Notes (Signed)
Received allergy injections per CMA

## 2019-08-29 NOTE — Patient Instructions (Signed)
0.5ccTGW in left arm sq 0.5cc D-MDM in right arm sq No reaction, pt tolerated well   

## 2019-08-30 ENCOUNTER — Encounter: Payer: Self-pay | Admitting: Internal Medicine

## 2019-09-03 ENCOUNTER — Encounter: Payer: Self-pay | Admitting: Internal Medicine

## 2019-09-03 ENCOUNTER — Ambulatory Visit (INDEPENDENT_AMBULATORY_CARE_PROVIDER_SITE_OTHER): Payer: 59 | Admitting: Internal Medicine

## 2019-09-03 ENCOUNTER — Other Ambulatory Visit: Payer: Self-pay

## 2019-09-03 VITALS — BP 130/80 | HR 66 | Temp 98.0°F | Ht 63.75 in | Wt 156.0 lb

## 2019-09-03 DIAGNOSIS — I10 Essential (primary) hypertension: Secondary | ICD-10-CM

## 2019-09-03 DIAGNOSIS — Z8719 Personal history of other diseases of the digestive system: Secondary | ICD-10-CM | POA: Diagnosis not present

## 2019-09-03 DIAGNOSIS — R1032 Left lower quadrant pain: Secondary | ICD-10-CM | POA: Diagnosis not present

## 2019-09-03 LAB — CBC WITH DIFFERENTIAL/PLATELET
Absolute Monocytes: 407 cells/uL (ref 200–950)
Basophils Absolute: 30 cells/uL (ref 0–200)
Basophils Relative: 0.5 %
Eosinophils Absolute: 148 cells/uL (ref 15–500)
Eosinophils Relative: 2.5 %
HCT: 38.5 % (ref 35.0–45.0)
Hemoglobin: 12.9 g/dL (ref 11.7–15.5)
Lymphs Abs: 1510 cells/uL (ref 850–3900)
MCH: 30.4 pg (ref 27.0–33.0)
MCHC: 33.5 g/dL (ref 32.0–36.0)
MCV: 90.8 fL (ref 80.0–100.0)
MPV: 9.7 fL (ref 7.5–12.5)
Monocytes Relative: 6.9 %
Neutro Abs: 3806 cells/uL (ref 1500–7800)
Neutrophils Relative %: 64.5 %
Platelets: 288 10*3/uL (ref 140–400)
RBC: 4.24 10*6/uL (ref 3.80–5.10)
RDW: 12.3 % (ref 11.0–15.0)
Total Lymphocyte: 25.6 %
WBC: 5.9 10*3/uL (ref 3.8–10.8)

## 2019-09-05 NOTE — Progress Notes (Signed)
   Subjective:    Patient ID: Ann [facility], female    DOB: August 03, 1960, 59 y.o.   MRN: [id number]  HPI 59 year old Female here with abdominal pain onset over the Easter holiday. Feels better today. History of diverticulitis 2018. Had colonoscopy 2018 by Dr. Cristina Gong with 10 year follow up recommended.  No nausea vomiting fever or chills.  Just abdominal discomfort.    Review of Systems see above as she has a history of essential hypertension well-controlled on medication.  History of allergic rhinitis and receives allergy injections.     Objective:   Physical Exam Blood pressure 130/80, pulse 66 regular pulse oximetry 97% temperature 98 degrees orally weight 156 pounds BMI 26.99.  Skin warm and dry.  Chest clear to auscultation.  Cardiac exam regular rate and rhythm.  Abdomen soft nondistended without hepatosplenomegaly or masses.  Very mild tenderness in the left lower quadrant without rebound tenderness.       Assessment & Plan:  It is likely patient had a bout of mild diverticulitis.  She is improving.  We talked about antibiotic therapy but she would like to hold off at this point in time.  She should stay with clear liquids a day or so and advance to soft and then regular diet over the next several days.  Call if symptoms or not improving or worsen.

## 2019-09-06 ENCOUNTER — Ambulatory Visit: Payer: 59 | Admitting: Internal Medicine

## 2019-09-12 ENCOUNTER — Ambulatory Visit: Payer: 59 | Admitting: Internal Medicine

## 2019-09-13 ENCOUNTER — Ambulatory Visit: Payer: 59 | Admitting: Internal Medicine

## 2019-09-13 ENCOUNTER — Ambulatory Visit: Payer: 59 | Attending: Internal Medicine

## 2019-09-13 DIAGNOSIS — Z23 Encounter for immunization: Secondary | ICD-10-CM

## 2019-09-13 NOTE — Progress Notes (Signed)
   Covid-19 Vaccination Clinic  Name:  Ann Chambers    MRN: [id number] DOB: 1960/06/24  09/13/2019  [name] was observed post Covid-19 immunization for 30 minutes based on pre-vaccination screening without incident. She was provided with Vaccine Information Sheet and instruction to access the V-Safe system.   [name] was instructed to call 911 with any severe reactions post vaccine: Marland Kitchen Difficulty breathing  . Swelling of face and throat  . A fast heartbeat  . A bad rash all over body  . Dizziness and weakness   Immunizations Administered    Name Date Dose VIS Date Route   Pfizer COVID-19 Vaccine 09/13/2019  1:41 PM 0.3 mL 05/10/2019 Intramuscular   Manufacturer: [place]   Lot: [id number]   [place]: [id number]

## 2019-09-19 ENCOUNTER — Ambulatory Visit: Payer: 59 | Admitting: Internal Medicine

## 2019-09-21 ENCOUNTER — Encounter: Payer: Self-pay | Admitting: Internal Medicine

## 2019-09-21 NOTE — Patient Instructions (Signed)
Advance diet slowly and monitor abdominal pain.  Call if symptoms fail to improve or worsen.

## 2019-09-23 ENCOUNTER — Other Ambulatory Visit: Payer: Self-pay

## 2019-09-23 ENCOUNTER — Other Ambulatory Visit: Payer: 59 | Admitting: Internal Medicine

## 2019-09-23 DIAGNOSIS — Z0000 Encounter for general adult medical examination without abnormal findings: Secondary | ICD-10-CM

## 2019-09-23 DIAGNOSIS — Z1329 Encounter for screening for other suspected endocrine disorder: Secondary | ICD-10-CM

## 2019-09-23 DIAGNOSIS — I10 Essential (primary) hypertension: Secondary | ICD-10-CM

## 2019-09-23 DIAGNOSIS — Z13220 Encounter for screening for lipoid disorders: Secondary | ICD-10-CM

## 2019-09-24 LAB — LIPID PANEL
Cholesterol: 188 mg/dL (ref <200)
HDL: 92 mg/dL (ref >=50)
LDL Cholesterol (Calc): 83 mg/dL (calc)
Non-HDL Cholesterol (Calc): 96 mg/dL (calc) (ref <130)
Total CHOL/HDL Ratio: 2 (calc) (ref <5.0)
Triglycerides: 54 mg/dL (ref <150)

## 2019-09-24 LAB — COMPLETE METABOLIC PANEL WITH GFR
AG Ratio: 1.7 (calc) (ref 1.0–2.5)
ALT: 19 U/L (ref 6–29)
AST: 20 U/L (ref 10–35)
Albumin: 4.3 g/dL (ref 3.6–5.1)
Alkaline phosphatase (APISO): 94 U/L (ref 37–153)
BUN: 17 mg/dL (ref 7–25)
CO2: 25 mmol/L (ref 20–32)
Calcium: 9.4 mg/dL (ref 8.6–10.4)
Chloride: 105 mmol/L (ref 98–110)
Creat: 0.72 mg/dL (ref 0.50–0.99)
GFR, Est African American: 105 mL/min/{1.73_m2} (ref >=60)
GFR, Est Non African American: 91 mL/min/{1.73_m2} (ref >=60)
Globulin: 2.6 g/dL (calc) (ref 1.9–3.7)
Glucose, Bld: 78 mg/dL (ref 65–99)
Potassium: 4.3 mmol/L (ref 3.5–5.3)
Sodium: 139 mmol/L (ref 135–146)
Total Bilirubin: 0.6 mg/dL (ref 0.2–1.2)
Total Protein: 6.9 g/dL (ref 6.1–8.1)

## 2019-09-24 LAB — CBC WITH DIFFERENTIAL/PLATELET
Absolute Monocytes: 400 cells/uL (ref 200–950)
Basophils Absolute: 41 cells/uL (ref 0–200)
Basophils Relative: 0.7 %
Eosinophils Absolute: 209 cells/uL (ref 15–500)
Eosinophils Relative: 3.6 %
HCT: 39.8 % (ref 35.0–45.0)
Hemoglobin: 13.2 g/dL (ref 11.7–15.5)
Lymphs Abs: 1375 cells/uL (ref 850–3900)
MCH: 30.3 pg (ref 27.0–33.0)
MCHC: 33.2 g/dL (ref 32.0–36.0)
MCV: 91.5 fL (ref 80.0–100.0)
MPV: 10.4 fL (ref 7.5–12.5)
Monocytes Relative: 6.9 %
Neutro Abs: 3776 cells/uL (ref 1500–7800)
Neutrophils Relative %: 65.1 %
Platelets: 311 10*3/uL (ref 140–400)
RBC: 4.35 10*6/uL (ref 3.80–5.10)
RDW: 12.3 % (ref 11.0–15.0)
Total Lymphocyte: 23.7 %
WBC: 5.8 10*3/uL (ref 3.8–10.8)

## 2019-09-24 LAB — VITAMIN D 25 HYDROXY (VIT D DEFICIENCY, FRACTURES): Vit D, 25-Hydroxy: 29 ng/mL — ABNORMAL LOW (ref 30–100)

## 2019-09-24 LAB — TSH: TSH: 1.2 mIU/L (ref 0.40–4.50)

## 2019-09-26 ENCOUNTER — Encounter: Payer: Self-pay | Admitting: Internal Medicine

## 2019-09-26 ENCOUNTER — Other Ambulatory Visit: Payer: Self-pay

## 2019-09-26 ENCOUNTER — Ambulatory Visit (INDEPENDENT_AMBULATORY_CARE_PROVIDER_SITE_OTHER): Payer: 59 | Admitting: Internal Medicine

## 2019-09-26 VITALS — BP 120/88 | HR 74 | Temp 98.3°F | Ht 66.0 in | Wt 157.0 lb

## 2019-09-26 DIAGNOSIS — R911 Solitary pulmonary nodule: Secondary | ICD-10-CM

## 2019-09-26 DIAGNOSIS — I10 Essential (primary) hypertension: Secondary | ICD-10-CM

## 2019-09-26 DIAGNOSIS — Z0000 Encounter for general adult medical examination without abnormal findings: Secondary | ICD-10-CM

## 2019-09-26 DIAGNOSIS — Z801 Family history of malignant neoplasm of trachea, bronchus and lung: Secondary | ICD-10-CM | POA: Diagnosis not present

## 2019-09-26 DIAGNOSIS — J3089 Other allergic rhinitis: Secondary | ICD-10-CM

## 2019-09-26 DIAGNOSIS — Z8719 Personal history of other diseases of the digestive system: Secondary | ICD-10-CM

## 2019-09-26 LAB — POCT URINALYSIS DIPSTICK
Appearance: NEGATIVE
Bilirubin, UA: NEGATIVE
Blood, UA: NEGATIVE
Glucose, UA: NEGATIVE
Ketones, UA: NEGATIVE
Leukocytes, UA: NEGATIVE
Nitrite, UA: NEGATIVE
Odor: NEGATIVE
Protein, UA: NEGATIVE
Spec Grav, UA: 1.01 (ref 1.010–1.025)
Urobilinogen, UA: 0.2 E.U./dL
pH, UA: 6.5 (ref 5.0–8.0)

## 2019-09-26 NOTE — Patient Instructions (Signed)
It was a pleasure to see you today.  Allergy injections given today.  CT of lung ordered to follow-up on likely benign nodules.  Continue current medications.  Follow-up in 1 year for health maintenance and continue with allergy injections as recommended by allergist.

## 2019-09-26 NOTE — Progress Notes (Signed)
Subjective:    Patient ID: Ann [facility], female    DOB: January 08, 1961, 59 y.o.   MRN: [id number]  HPI 59 year old Female for health maintenance exam and evaluation of medical issues.  Treated recently for a bout of diverticulitis and improved.  She has a family history of lung cancer in her sister who is a non-smoker.  She had CT calcium score in April 2018.  Score was 0.  However there were 2 small pulmonary nodules.  With her family history of lung cancer in her sister she would like to repeat this scan.  It has now been nearly 3 years since the original scan was done.  These pulmonary nodules the largest of which was 4 mm.  It was felt that the patient did not need follow-up and she was low risk.  However patient feels that given her sister's history she would like to have another scan.  She also had an ectatic ascending thoracic aorta on measuring 4.0 cm and repeat imaging was recommended.  Has been out of 2018.  Had colonoscopy by Dr. [name] which was benign with 10-year follow-up recommended.  History of allergic rhinitis and receives immunotherapy.  Has environmental and seasonal allergies.  DEXA scan 2015 was normal.  Mammogram March 2020 done at [facility].  Concern regarding palpable lump at 4:00 very posterior.  Ultrasound recommended.  This was felt to be a 2.8 oval lipoma confirmed by sonography in the left breast at 3:00 10 cm from the nipple.  Does not take flu vaccine due to history of egg Chambers although reaction is only nausea.  Says she has only had flu once and that was when she was a child.  History of migraine headaches.  History of scoliosis and receives massage therapy for issues with back pain.  In June 2016 she saw Dr. [name] for annual skin checkup.  There was an area in the right lateral lower thigh which was thought to be a lipoma which was removed.  Pathology showed adipose tissue.  No atypia was observed.  No known drug allergies.  In 2015 she had chest  pain and EKG was normal.  Had appendectomy at [facility] in 1996.  Right shoulder surgery in 2002 in 2005.  Patient was seen in the emergency department with acute abdominal pain December 1997.  CT showed thickening of the midportion of the ascending colon.  Subsequently had colonoscopy in 1998 which was normal.  History of lactose intolerance and irritable bowel syndrome.  She [facility] October [facility].  She was heterozygous for MUTYH.  Was thought to have moderate increased risk for colon polyps but has had negative colonoscopy.  No other hereditary predisposition to cancer was identified with her consultation.  Social history: Married with no children.  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  Does not smoke.  Social alcohol consumption.      Review of Systems  Constitutional: Negative.   Respiratory: Negative.   Cardiovascular: Negative.   Gastrointestinal: Negative.   Genitourinary: Negative.   Neurological: Negative.   Psychiatric/Behavioral: Negative.        Objective:   Physical Exam Constitutional:      General: She is not in acute distress.    Appearance: Normal appearance.  HENT:     Head: Normocephalic and atraumatic.     Right Ear: Tympanic membrane normal.     Left Ear: Tympanic membrane normal.  Nose: Nose normal.  Eyes:     General: No scleral icterus.       Right eye: No discharge.        Left eye: No discharge.     Extraocular Movements: Extraocular movements intact.     Conjunctiva/sclera: Conjunctivae normal.     Pupils: Pupils are equal, round, and reactive to light.  Neck:     Vascular: No carotid bruit.     Comments: Breasts without masses Cardiovascular:     Rate and Rhythm: Normal rate and regular rhythm.     Heart sounds: Normal heart sounds. No murmur.  Pulmonary:     Effort: Pulmonary effort is normal.     Breath sounds: Normal breath sounds. No  rales.  Abdominal:     General: Bowel sounds are normal. There is no distension.     Palpations: Abdomen is soft. There is no mass.     Tenderness: There is no abdominal tenderness. There is no guarding or rebound.     Hernia: No hernia is present.  Genitourinary:    Comments: Deferred to GYN Musculoskeletal:     Cervical back: Neck supple. No rigidity.     Right lower leg: No edema.     Left lower leg: No edema.  Lymphadenopathy:     Cervical: No cervical adenopathy.  Skin:    General: Skin is warm and dry.     Findings: No rash.  Neurological:     General: No focal deficit present.     Mental Status: She is alert and oriented to person, place, and time.     Cranial Nerves: No cranial nerve deficit.     Sensory: No sensory deficit.     Motor: No weakness.     Coordination: Coordination normal.     Gait: Gait normal.  Psychiatric:        Mood and Affect: Mood normal.        Behavior: Behavior normal.        Thought Content: Thought content normal.        Judgment: Judgment normal.           Assessment & Plan:   Mild Vitamin D deficiency-recommend taking 4000 units vitamin D3 daily  History of lipoma left breast -evaluated at [facility] with ultrasound and mammography  History of MUTYH gene  Family history of lung cancer in sister-patient has 2 small pulmonary nodules and would like to have follow-up CT scan surveillance for those nodules.  This will be ordered.  Essential hypertension-stable on current regimen  History of diverticulitis  Seasonal and environmental allergies-immunotherapy given today  History of migraine headaches  History of egg Chambers  Plan: She is seen here regularly for Chambers injections but Chambers care provided by [facility].  Ordered CT of the chest for surveillance of nodules due to his family history of lung cancer in sister.  Return in 1 year or as needed.

## 2019-10-04 ENCOUNTER — Ambulatory Visit: Payer: 59 | Admitting: Internal Medicine

## 2019-10-07 ENCOUNTER — Ambulatory Visit: Payer: 59 | Attending: Internal Medicine

## 2019-10-10 ENCOUNTER — Telehealth: Payer: Self-pay | Admitting: Internal Medicine

## 2019-10-10 ENCOUNTER — Encounter: Payer: Self-pay | Admitting: Internal Medicine

## 2019-10-10 ENCOUNTER — Other Ambulatory Visit: Payer: Self-pay

## 2019-10-10 DIAGNOSIS — Z803 Family history of malignant neoplasm of breast: Secondary | ICD-10-CM

## 2019-10-10 NOTE — Telephone Encounter (Signed)
We are not able to get CT chest approved by insurance today for screening chest CT for lung cancer due to sister's history and patient having history of 2 small nodules right lung.  Patient has been told to have follow up by Cardiology.   There is a history of patient having cardiac screening in 2018 with 2 nodules noted in right lung and patient was told to have follow up.   Patient has no history of smoking.   Refer to Pulmonary for discussion of risk and advise if  screening at this time is appropriate.  Situation discussed by phone with patient.

## 2019-10-11 ENCOUNTER — Other Ambulatory Visit: Payer: 59

## 2019-10-11 ENCOUNTER — Other Ambulatory Visit: Payer: Self-pay | Admitting: Internal Medicine

## 2019-10-14 ENCOUNTER — Telehealth (INDEPENDENT_AMBULATORY_CARE_PROVIDER_SITE_OTHER): Payer: 59 | Admitting: Internal Medicine

## 2019-10-14 ENCOUNTER — Other Ambulatory Visit: Payer: 59

## 2019-10-14 DIAGNOSIS — I77810 Thoracic aortic ectasia: Secondary | ICD-10-CM | POA: Diagnosis not present

## 2019-10-14 NOTE — Telephone Encounter (Signed)
Patient has Family history in sister of lung cancer who was a non-smoker. Patient had 2 solid pulmonary nodules on CT cardiac scoring over read by Radiologist in 2018- largest was 4 mm and was not thought to be high risk at the time. This is before sister was dx with lung cancer.   Does not qualify for lung cancer screening by Pulmonary as she is a nonsmoker.   Patient  has ectatic thoracic aorta from 2018 CT cardiac scoring study  and diameter was 4 cm. At that time.  Needs follow up of that. Will place order for CT angio of chest.  Spoke with patient today by phone. Will place order for CT angio.

## 2019-10-17 ENCOUNTER — Other Ambulatory Visit: Payer: Self-pay | Admitting: *Deleted

## 2019-10-17 DIAGNOSIS — Z801 Family history of malignant neoplasm of trachea, bronchus and lung: Secondary | ICD-10-CM

## 2019-10-21 ENCOUNTER — Ambulatory Visit: Payer: 59 | Admitting: Internal Medicine

## 2019-10-22 ENCOUNTER — Other Ambulatory Visit: Payer: Self-pay

## 2019-10-22 ENCOUNTER — Ambulatory Visit (INDEPENDENT_AMBULATORY_CARE_PROVIDER_SITE_OTHER): Payer: 59 | Admitting: Internal Medicine

## 2019-10-22 ENCOUNTER — Encounter: Payer: Self-pay | Admitting: Internal Medicine

## 2019-10-22 VITALS — Ht 66.0 in

## 2019-10-22 DIAGNOSIS — J3089 Other allergic rhinitis: Secondary | ICD-10-CM

## 2019-10-22 DIAGNOSIS — R911 Solitary pulmonary nodule: Secondary | ICD-10-CM

## 2019-10-22 DIAGNOSIS — I10 Essential (primary) hypertension: Secondary | ICD-10-CM | POA: Diagnosis not present

## 2019-10-22 DIAGNOSIS — I77810 Thoracic aortic ectasia: Secondary | ICD-10-CM | POA: Diagnosis not present

## 2019-10-22 NOTE — Patient Instructions (Signed)
Immunotherapy for allergic rhinitis. Is to have CT chest angio soon to follow up on pulmonary nodules and ectatic aorta. Discussed this with patient today. Patient was provided copy of previous chest CT.

## 2019-10-22 NOTE — Progress Notes (Signed)
   Subjective:    Patient ID: SAMA CIALLELLA, female    DOB: [birth year], 59 y.o.   MRN: [id number]  HPI 59 year old Female in today for immunotherapy for environmental pain seasonal allergies.  Patient had CT coronary calcium score in April 2018.  That score was 0.  However, Radiology over read noted that patient had 2 scattered solid pulmonary nodules largest being 4 mm in the anterior right middle lobe.  No follow-up was recommended and patient was low risk.  She has never smoked.  However her sister was subsequently diagnosed with lung cancer who was also a known smoker and this was concerning to the patient.  She wanted to get repeat CT testing.  Also she was found to have an ectatic ascending thoracic aorta maximal diameter 4 cm.  She says her sister has had a similar finding as well.  Based on these findings I would like to get repeat CT angio to evaluate both ascending thoracic aorta and the 2 lung nodules that were noted previously in 2018.  She is agreeable to this and is anxious to receive this testing.  Patient has a history of hypertension and blood pressure is stable on current blood pressure medication consisting of Diovan 40 mg daily.  She exercises regularly.  Does not smoke.  Social alcohol consumption.  Review of Systems see above     Objective:   Physical Exam  Not examined.  Allergy injections administered by CMA      Assessment & Plan:  Ectatic ascending thoracic aorta  2 small lung nodules  Seasonal and environmental allergies for which she is receiving immunotherapy  Plan: CT angio has been ordered for June.  Spoke with her today about these findings and recommendations.  We may want to seek cardiothoracic consultation regarding these issues.  Also, we were able to get pulmonary consultation for her in the near future.

## 2019-10-31 ENCOUNTER — Other Ambulatory Visit: Payer: Self-pay

## 2019-10-31 ENCOUNTER — Inpatient Hospital Stay: Admission: RE | Admit: 2019-10-31 | Payer: 59 | Source: Ambulatory Visit

## 2019-10-31 DIAGNOSIS — I77810 Thoracic aortic ectasia: Secondary | ICD-10-CM

## 2019-10-31 DIAGNOSIS — R911 Solitary pulmonary nodule: Secondary | ICD-10-CM

## 2019-10-31 NOTE — Telephone Encounter (Signed)
Spoke to patient this am. Case has gone to Patent examiner.

## 2019-10-31 NOTE — Telephone Encounter (Signed)
Pt calling in saying that her insurance canceled the CT scan again, She said that the lady at imaging did say they are fighting with the insurance company to get it done but also told the pt to reach out to Dr Renold Genta and let her know because they said she might have to get involved again. Sandre Kitty at [facility] was who she talked to. Pt said she goes on vacation 11/09/19 through 11/16/19 and that she has something on 11/19/19 and that it would need to be after 11/20/19

## 2019-11-05 ENCOUNTER — Ambulatory Visit: Payer: 59 | Admitting: Internal Medicine

## 2019-11-07 ENCOUNTER — Other Ambulatory Visit: Payer: Self-pay

## 2019-11-07 ENCOUNTER — Encounter: Payer: Self-pay | Admitting: Internal Medicine

## 2019-11-07 ENCOUNTER — Ambulatory Visit (INDEPENDENT_AMBULATORY_CARE_PROVIDER_SITE_OTHER): Payer: 59 | Admitting: Internal Medicine

## 2019-11-07 VITALS — BP 130/88 | HR 68 | Ht 66.0 in | Wt 157.0 lb

## 2019-11-07 DIAGNOSIS — Z298 Encounter for other specified prophylactic measures: Secondary | ICD-10-CM

## 2019-11-07 DIAGNOSIS — J3089 Other allergic rhinitis: Secondary | ICD-10-CM

## 2019-11-07 NOTE — Patient Instructions (Signed)
0.5ccTGW in left arm sq 0.5cc D-MDM in right arm sq No reaction, pt tolerated well   

## 2019-11-07 NOTE — Progress Notes (Signed)
Allergy injections given by CMA

## 2019-11-15 ENCOUNTER — Other Ambulatory Visit: Payer: Self-pay

## 2019-11-19 ENCOUNTER — Other Ambulatory Visit: Payer: Self-pay | Admitting: Internal Medicine

## 2019-11-19 ENCOUNTER — Other Ambulatory Visit: Payer: Self-pay

## 2019-11-19 DIAGNOSIS — I77810 Thoracic aortic ectasia: Secondary | ICD-10-CM

## 2019-11-20 ENCOUNTER — Other Ambulatory Visit: Payer: Self-pay | Admitting: Internal Medicine

## 2019-11-20 ENCOUNTER — Ambulatory Visit: Admission: RE | Admit: 2019-11-20 | Payer: 59 | Source: Ambulatory Visit

## 2019-11-20 ENCOUNTER — Ambulatory Visit
Admission: RE | Admit: 2019-11-20 | Discharge: 2019-11-20 | Disposition: A | Discharge status: Home / Self Care | Payer: 59 | Source: Ambulatory Visit | Attending: Internal Medicine | Admitting: Internal Medicine

## 2019-11-20 DIAGNOSIS — I77810 Thoracic aortic ectasia: Secondary | ICD-10-CM

## 2019-11-20 DIAGNOSIS — Z801 Family history of malignant neoplasm of trachea, bronchus and lung: Secondary | ICD-10-CM

## 2019-11-20 DIAGNOSIS — R911 Solitary pulmonary nodule: Secondary | ICD-10-CM

## 2019-11-25 ENCOUNTER — Other Ambulatory Visit: Payer: Self-pay

## 2019-11-25 ENCOUNTER — Other Ambulatory Visit: Payer: Self-pay | Admitting: Cardiothoracic Surgery

## 2019-11-25 ENCOUNTER — Institutional Professional Consult (permissible substitution): Payer: 59 | Admitting: Cardiothoracic Surgery

## 2019-11-25 VITALS — BP 135/89 | HR 83 | Temp 98.1°F | Resp 20 | Ht 64.0 in | Wt 152.0 lb

## 2019-11-25 DIAGNOSIS — I7121 Aneurysm of the ascending aorta, without rupture: Secondary | ICD-10-CM

## 2019-11-25 DIAGNOSIS — I712 Thoracic aortic aneurysm, without rupture: Secondary | ICD-10-CM

## 2019-11-26 ENCOUNTER — Encounter: Payer: Self-pay | Admitting: Internal Medicine

## 2019-11-26 ENCOUNTER — Ambulatory Visit (INDEPENDENT_AMBULATORY_CARE_PROVIDER_SITE_OTHER): Payer: 59 | Admitting: Internal Medicine

## 2019-11-26 VITALS — BP 120/88 | HR 80 | Ht 63.5 in | Wt 156.0 lb

## 2019-11-26 DIAGNOSIS — J3089 Other allergic rhinitis: Secondary | ICD-10-CM

## 2019-11-26 DIAGNOSIS — Z298 Encounter for other specified prophylactic measures: Secondary | ICD-10-CM

## 2019-11-26 NOTE — Progress Notes (Signed)
[address]       [place], [address]             [phone]     CARDIOTHORACIC SURGERY OFFICE REPORT  Referring Provider is Baxley, Cresenciano Lick, MD Primary Cardiologist is No primary care provider on file. PCP is Baxley, Cresenciano Lick, MD  Chief Complaint  Patient presents with  . Thoracic Aortic Aneurysm    Surgical eval, Chest CT 11/20/19    HPI:  59 year old lady is referred for assessment of ascending aortic aneurysm.  She was in her usual state of health until approximately 3 years ago when she underwent CT scanning to evaluate cough.  This demonstrated an incidentally discovered ascending aortic aneurysm measuring approximately 43 mm in diameter.  The intention was for this to be followed up in the next year but due to COVID-19 and avoidance of care, she is presenting now 3 years after the original discovery.  In the meantime there have been no symptoms of chest pain or shortness of breath.  She is quite active and is not limited in her level of activity.  She has a vague family history of aneurysms and that her uncle died of a supposed aneurysm.  She has no personal history of aneurysm or malignant high blood pressure.  She does not smoke  Past Medical History:  Diagnosis Date  . Allergic rhinitis   . Egg allergy   . Essential hypertension   . Family history of lung cancer   . Family history of prostate cancer   . Migraines    Resolved after menopause  . Scoliosis     Past Surgical History:  Procedure Laterality Date  . APPENDECTOMY    . right shoulder surgery 2002 and 2005      Family History  Problem Relation Age of Onset  . CAD Father        h/o bypass in his 44s, possible heart issues in his 87s  . Hypertension Father   . Prostate cancer Father        was not metastatic, but his doctors were concerned it could spread  . Heart failure Mother        Rheumatic fever, nonischemic  . Hypertension Mother   . Rheumatic fever Mother   . Heart  attack Paternal Grandmother   . Heart disease Paternal Grandfather   . Hypertension Sister   . Lung cancer Sister 14       non smoker- had genetic testing and was positve for a gene- thinks it was EGFR?  . Heart attack Paternal Aunt   . Kidney disease Paternal Uncle   . Other Paternal Barbaraann Rondo        'ruptured aorta'    Social History   Socioeconomic History  . Marital status: Married    Spouse name: Not on file  . Number of children: Not on file  . Years of education: Not on file  . Highest education level: Not on file  Occupational History  . Not on file  Tobacco Use  . Smoking status: Never Smoker  . Smokeless tobacco: Never Used  Substance and Sexual Activity  . Alcohol use: Yes    Alcohol/week: 3.0 standard drinks    Types: 3 Glasses of wine per week    Comment: Glass of wine 3-5 nights a week  . Drug use: No  . Sexual activity: Yes    Birth control/protection: Post-menopausal  Other Topics Concern  . Not  on file  Social History Narrative  . Not on file   Social Determinants of Health   Financial Resource Strain:   . Difficulty of Paying Living Expenses:   Food Insecurity:   . Worried About Charity fundraiser in the Last Year:   . Arboriculturist in the Last Year:   Transportation Needs:   . Film/video editor (Medical):   Marland Kitchen Lack of Transportation (Non-Medical):   Physical Activity:   . Days of Exercise per Week:   . Minutes of Exercise per Session:   Stress:   . Feeling of Stress :   Social Connections:   . Frequency of Communication with Friends and Family:   . Frequency of Social Gatherings with Friends and Family:   . Attends Religious Services:   . Active Member of Clubs or Organizations:   . Attends Archivist Meetings:   Marland Kitchen Marital Status:   Intimate Partner Violence:   . Fear of Current or Ex-Partner:   . Emotionally Abused:   Marland Kitchen Physically Abused:   . Sexually Abused:     Current Outpatient Medications  Medication Sig Dispense  Refill  . ADVANCED EVENING PRIMROSE OIL PO Take 1,300 mg by mouth daily.    . cetirizine (ZYRTEC) 10 MG tablet Take 10 mg by mouth daily as needed for allergies.    Marland Kitchen co-enzyme Q-10 30 MG capsule Take 100 mg by mouth daily.    Marland Kitchen ELDERBERRY PO Take 1 capsule by mouth daily. 400 mg Black Elderberry with Vitamin C 100 mg and Zinc 7.73m    . Misc Natural Products (TART CHERRY ADVANCED) CAPS Take 2 capsules by mouth daily.    . Multiple Vitamins-Minerals (PRESERVISION AREDS 2) CAPS Take 2 capsules by mouth daily.    . Omega 3 1200 MG CAPS Take by mouth.    . valsartan (DIOVAN) 40 MG tablet TAKE 1 TABLET BY MOUTH EVERY DAY 90 tablet 3  . VITAMIN D, CHOLECALCIFEROL, PO Take 2,000 Units by mouth.     . EPIPEN 2-PAK 0.3 MG/0.3ML DEVI      No current facility-administered medications for this visit.    Allergies  Allergen Reactions  . Eggs Or Egg-Derived Products Nausea Only    Raw eggs only  . Peanuts [Peanut Oil] Anaphylaxis      Review of Systems:   General:  No change appetite,energy, or weight  Cardiac:  Denies chest pain with exertion or SOB with exertion, denies arrhythmia/ atrial fibrillation  Respiratory:  Negative  GI:   History of colonoscopy in 2019  GU:   Negative  Vas vacular:  History of varicose veins,   Neuro:   Negative  Musculoskeletal: Negative  Skin:   Negative  Psych:   Negative  Eyes:   wears contacts  ENT:    last saw dentist 6/21  Hematologic:  Negative  Endocrine:  No diabetes, does not check CBG's at home     Physical Exam:   BP 135/89   Pulse 83   Temp 98.1 F (36.7 C) (Temporal)   Resp 20   Ht '5\' 4"'  (1.626 m)   Wt 68.9 kg   LMP 10/11/2011   SpO2 96% Comment: RA  BMI 26.09 kg/m   General:    well-appearing  HEENT:  Unremarkable   Neck:   no JVD, no bruits, no adenopathy  Chest:   clear to auscultation, symmetrical breath sounds, no wheezes, no rhonchi   CV:   RRR, no murmur  Abdomen:  soft, non-tender, no masses   Extremities:  warm,  well-perfused, pulses intact, no LE edema  Rectal/GU  Deferred  Neuro:   Grossly non-focal and symmetrical throughout  Skin:   Clean and dry, no rashes, no breakdown   Diagnostic Tests:  Have personally reviewed her available imaging studies including CT scan of the chest from 11/20/2019 and agree with their interpretation   Impression:  59 year old lady with a sub-5 cm ascending aortic aneurysm having no other worrisome features associated with it   Plan:  Follow-up in 1 year with repeat CT scan for aneurysm surveillance This does not need to be obtained with contrast   I spent in excess of 30 minutes during the conduct of this office consultation and >50% of this time involved direct face-to-face encounter with the patient for counseling and/or coordination of their care.          Level 3 Office Consult = 40 minutes         Level 4 Office Consult = 60 minutes         Level 5 Office Consult = 80 minutes  B.  Murvin Natal, MD 11/26/2019 8:30 AM

## 2019-11-26 NOTE — Patient Instructions (Signed)
0.5ccTGW in left arm sq 0.5cc D-MDM in right arm sq No reaction, pt tolerated well   

## 2019-11-26 NOTE — Progress Notes (Signed)
Saw CV surgeon recently for dilated aorta. Immunotherapy given by CMA today. Has upcoming Pulmonary appt to discuss surveillance of pulmonary nodules.

## 2019-12-12 ENCOUNTER — Ambulatory Visit (INDEPENDENT_AMBULATORY_CARE_PROVIDER_SITE_OTHER): Payer: BC Managed Care – PPO | Admitting: Emergency Medicine

## 2019-12-12 ENCOUNTER — Other Ambulatory Visit: Payer: Self-pay

## 2019-12-12 ENCOUNTER — Encounter: Payer: Self-pay | Admitting: Emergency Medicine

## 2019-12-12 DIAGNOSIS — R918 Other nonspecific abnormal finding of lung field: Secondary | ICD-10-CM

## 2019-12-12 NOTE — Progress Notes (Signed)
Subjective:    Patient ID: Ann Chambers, female    DOB: Dec 29, 1960, 59 y.o.   MRN: [id number]  HPI 59 year old never smoker with a history of hypertension, allergic rhinitis on immunotherapy, scoliosis, under recent evaluation by Dr. Orvan Seen for an ascending aortic aneurysm.  She feels well, works out regularly, does not have any CP, cough, sputum, wheeze.   CT chest 11/20/2019 reviewed by me and compared to cardiac CT done on 09/01/2016, shows some biapical scar, 3 mm calcified left upper lobe nodule, stable noncalcified 3 mm left upper lobe nodule, stable 4 mm right middle lobe nodules.  No new nodules or worrisome findings.    Review of Systems As per HPI  Past Medical History:  Diagnosis Date  . Allergic rhinitis   . Egg allergy   . Essential hypertension   . Family history of lung cancer   . Family history of prostate cancer   . Migraines    Resolved after menopause  . Scoliosis      Family History  Problem Relation Age of Onset  . CAD Father        h/o bypass in his 26s, possible heart issues in his 83s  . Hypertension Father   . Prostate cancer Father        was not metastatic, but his doctors were concerned it could spread  . Heart failure Mother        Rheumatic fever, nonischemic  . Hypertension Mother   . Rheumatic fever Mother   . Heart attack Paternal Grandmother   . Heart disease Paternal Grandfather   . Hypertension Sister   . Lung cancer Sister 10       non smoker- had genetic testing and was positve for a gene- thinks it was EGFR?  . Heart attack Paternal Aunt   . Kidney disease Paternal Uncle   . Other Paternal Barbaraann Rondo        'ruptured aorta'     Social History   Socioeconomic History  . Marital status: Married    Spouse name: Not on file  . Number of children: Not on file  . Years of education: Not on file  . Highest education level: Not on file  Occupational History  . Not on file  Tobacco Use  . Smoking status: Never Smoker  .  Smokeless tobacco: Never Used  Substance and Sexual Activity  . Alcohol use: Yes    Alcohol/week: 3.0 standard drinks    Types: 3 Glasses of wine per week    Comment: Glass of wine 3-5 nights a week  . Drug use: No  . Sexual activity: Yes    Birth control/protection: Post-menopausal  Other Topics Concern  . Not on file  Social History Narrative  . Not on file   Social Determinants of Health   Financial Resource Strain:   . Difficulty of Paying Living Expenses:   Food Insecurity:   . Worried About Charity fundraiser in the Last Year:   . Arboriculturist in the Last Year:   Transportation Needs:   . Film/video editor (Medical):   Marland Kitchen Lack of Transportation (Non-Medical):   Physical Activity:   . Days of Exercise per Week:   . Minutes of Exercise per Session:   Stress:   . Feeling of Stress :   Social Connections:   . Frequency of Communication with Friends and Family:   . Frequency of Social Gatherings with Friends and Family:   .  Attends Religious Services:   . Active Member of Clubs or Organizations:   . Attends Archivist Meetings:   Marland Kitchen Marital Status:   Intimate Partner Violence:   . Fear of Current or Ex-Partner:   . Emotionally Abused:   Marland Kitchen Physically Abused:   . 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    Allergies  Allergen Reactions  . Eggs Or Egg-Derived Products Nausea Only    Raw eggs only  . Peanuts [Peanut Oil] Anaphylaxis     Outpatient Medications Prior to Visit  Medication Sig Dispense Refill  . ADVANCED EVENING PRIMROSE OIL PO Take 1,300 mg by mouth daily.    . cetirizine (ZYRTEC) 10 MG tablet Take 10 mg by mouth daily as needed for allergies.    Marland Kitchen co-enzyme Q-10 30 MG capsule Take 100 mg by mouth daily.    Marland Kitchen ELDERBERRY PO Take 1 capsule by mouth daily. 400 mg Black Elderberry with Vitamin C 100 mg and  Zinc 7.31m    . EPIPEN 2-PAK 0.3 MG/0.3ML DEVI     . Misc Natural Products (TART CHERRY ADVANCED) CAPS Take 2 capsules by mouth daily.    . Multiple Vitamins-Minerals (PRESERVISION AREDS 2) CAPS Take 2 capsules by mouth daily.    . Omega 3 1200 MG CAPS Take by mouth.    . valsartan (DIOVAN) 40 MG tablet TAKE 1 TABLET BY MOUTH EVERY DAY 90 tablet 3  . VITAMIN D, CHOLECALCIFEROL, PO Take 2,000 Units by mouth.      No facility-administered medications prior to visit.        Objective:   Physical Exam  Vitals:   12/12/19 1153  BP: 124/66  Pulse: 77  Temp: 98.4 F (36.9 C)  TempSrc: Oral  SpO2: 99%  Weight: 154 lb 12.8 oz (70.2 kg)  Height: _0  (1.651 m)   Gen: Pleasant, well-nourished, in no distress,  normal affect  ENT: No lesions,  mouth clear,  oropharynx clear, no postnasal drip  Neck: No JVD, no stridor  Lungs: No use of accessory muscles, no crackles or wheezing on normal respiration, no wheeze on forced expiration  Cardiovascular: RRR, heart sounds normal, no murmur or gallops, no peripheral edema  Musculoskeletal: Scoliosis, no cyanosis or clubbing  Neuro: alert, awake, non focal  Skin: Warm, no lesions or rash      Assessment & Plan:  Pulmonary nodules/lesions, multiple Small (largest 4 mm) pulmonary nodules, 1 with calcification, stable at 3-year interval by CT chest.  Suspect due to granulomatous disease.  These can be deemed benign and she should not need any other imaging for this purpose.  She will have repeat CTs done to follow her aortic aneurysm with Dr. AOrvan Seen  If any new nodules appear that I can help her follow these for interval stability.  Your CT scan of the chest from June 2021 shows that the pulmonary nodules discovered in 2018 are unchanged, stable.  There are no new nodules.  Based on this the pulmonary nodules can be considered benign and you should not need any follow-up scans to track the nodules. You will likely need to have  intermittent repeat CT scan of the chest to follow your aortic aneurysm.  Please do so as per Dr. AOrvan Seen timing and recommendations.  If any new pulmonary nodules are identified on subsequent chest imaging then  we will be happy to follow these for stability with you.    Baltazar Apo, MD, PhD 12/12/2019, 12:21 PM [facility] [phone] or if no answer [phone]

## 2019-12-12 NOTE — Patient Instructions (Signed)
Your CT scan of the chest from June 2021 shows that the pulmonary nodules discovered in 2018 are unchanged, stable.  There are no new nodules.  Based on this the pulmonary nodules can be considered benign and you should not need any follow-up scans to track the nodules. You will likely need to have intermittent repeat CT scan of the chest to follow your aortic aneurysm.  Please do so as per Dr. Orvan Seen' timing and recommendations.  If any new pulmonary nodules are identified on subsequent chest imaging then we will be happy to follow these for stability with you.

## 2019-12-12 NOTE — Assessment & Plan Note (Signed)
Small (largest 4 mm) pulmonary nodules, 1 with calcification, stable at 3-year interval by CT chest.  Suspect due to granulomatous disease.  These can be deemed benign and she should not need any other imaging for this purpose.  She will have repeat CTs done to follow her aortic aneurysm with Dr. Orvan Seen.  If any new nodules appear that I can help her follow these for interval stability.  Your CT scan of the chest from June 2021 shows that the pulmonary nodules discovered in 2018 are unchanged, stable.  There are no new nodules.  Based on this the pulmonary nodules can be considered benign and you should not need any follow-up scans to track the nodules. You will likely need to have intermittent repeat CT scan of the chest to follow your aortic aneurysm.  Please do so as per Dr. Orvan Seen' timing and recommendations.  If any new pulmonary nodules are identified on subsequent chest imaging then we will be happy to follow these for stability with you.

## 2019-12-16 ENCOUNTER — Other Ambulatory Visit: Payer: Self-pay

## 2019-12-16 ENCOUNTER — Ambulatory Visit (INDEPENDENT_AMBULATORY_CARE_PROVIDER_SITE_OTHER): Payer: BC Managed Care – PPO | Admitting: Internal Medicine

## 2019-12-16 ENCOUNTER — Encounter: Payer: Self-pay | Admitting: Internal Medicine

## 2019-12-16 VITALS — BP 120/80 | HR 65 | Ht 64.0 in | Wt 152.0 lb

## 2019-12-16 DIAGNOSIS — Z298 Encounter for other specified prophylactic measures: Secondary | ICD-10-CM

## 2019-12-16 DIAGNOSIS — J3089 Other allergic rhinitis: Secondary | ICD-10-CM

## 2019-12-16 NOTE — Patient Instructions (Signed)
0.5ccTGW in left arm sq 0.5cc D-MDM in right arm sq No reaction, pt tolerated well   

## 2019-12-16 NOTE — Progress Notes (Signed)
Allergy injections given by CMA 

## 2019-12-17 ENCOUNTER — Ambulatory Visit (HOSPITAL_COMMUNITY): Payer: BC Managed Care – PPO | Attending: Cardiology

## 2019-12-17 DIAGNOSIS — I712 Thoracic aortic aneurysm, without rupture: Secondary | ICD-10-CM | POA: Diagnosis not present

## 2019-12-17 DIAGNOSIS — I7121 Aneurysm of the ascending aorta, without rupture: Secondary | ICD-10-CM

## 2019-12-17 LAB — ECHOCARDIOGRAM COMPLETE
Area-P 1/2: 2.63 cm2
S' Lateral: 3.6 cm

## 2019-12-30 ENCOUNTER — Ambulatory Visit: Payer: BC Managed Care – PPO | Admitting: Internal Medicine

## 2020-01-14 ENCOUNTER — Ambulatory Visit (INDEPENDENT_AMBULATORY_CARE_PROVIDER_SITE_OTHER): Payer: BC Managed Care – PPO | Admitting: Internal Medicine

## 2020-01-14 ENCOUNTER — Other Ambulatory Visit: Payer: Self-pay

## 2020-01-14 VITALS — BP 140/90 | HR 79 | Temp 97.9°F | Ht 64.0 in | Wt 154.0 lb

## 2020-01-14 DIAGNOSIS — I712 Thoracic aortic aneurysm, without rupture: Secondary | ICD-10-CM | POA: Diagnosis not present

## 2020-01-14 DIAGNOSIS — J3089 Other allergic rhinitis: Secondary | ICD-10-CM

## 2020-01-14 DIAGNOSIS — I7121 Aneurysm of the ascending aorta, without rupture: Secondary | ICD-10-CM

## 2020-01-14 DIAGNOSIS — R911 Solitary pulmonary nodule: Secondary | ICD-10-CM

## 2020-01-14 DIAGNOSIS — Z298 Encounter for other specified prophylactic measures: Secondary | ICD-10-CM | POA: Diagnosis not present

## 2020-01-14 DIAGNOSIS — I10 Essential (primary) hypertension: Secondary | ICD-10-CM

## 2020-01-14 NOTE — Progress Notes (Signed)
   Subjective:    Patient ID: Ann Chambers, female    DOB: 27-Oct-1960, 59 y.o.   MRN: [id number]  HPI 59 year old Female here to receive immunotherapy for environmental and seasonal allergies. Received 2 allergy injections  by CMA which she tolerated well with no side effects.  One allergy injection is for trees grasses and weeds.  The other injection is for dust, molds, and dust mites.  Patient has questions regarding her ascending aortic dilatation which was recently evaluated by Dr. Orvan Seen.  She had CT of the chest in June 2021.  Patient had surgical consultation with Dr. Orvan Seen , cardiothoracic surgeon in late June regarding chest CT which showed ascending aortic aneurysm measuring approximately 43 mm in diameter 3 years ago.  Dr. Orvan Seen ordered an echocardiogram showing an aortic dilatation in the ascending aorta measuring 40 mm.  Patient is wondering why there is a discrepancy in these measurements between these two studies.  I had suggested that she message Dr. Orvan Seen in my chart.  He feels that follow-up in 1 year is reasonable, and I would agree with that.    In June she had CT of the chest without contrast showing stable bilateral pulmonary nodules with the largest measuring 4 mm in the right middle lobe.  Radiologist thought these nodules were stable and that no further follow-up was required.  She also saw a pulmonologist who agreed with this.  With regard to pulmonary nodules she saw Dr. Lamonte Sakai, pulmonologist, in July who noted the pulmonary nodules discovered in 2018 were unchanged and stable.  No new nodules were noted.  Based on this he felt things nodules were benign and she should not need any further follow-up of these nodules.  She was concerned because her sister had a history of lung cancer.  She has a history of hypertension and blood pressure is well controlled.  She is on Diovan 40 mg daily.  Review of Systems     Objective:   Physical Exam BP 140/90 She was not  examined today but spent 20 minutes reviewing records and trying to answer her questions about size of aortic dilatation.  Allergy immunotherapy x2 given by [facility]      Assessment & Plan:  Allergic rhinitis  Essential hypertension-stable on current regimen Aortic dilatation  Benign pulmonary nodules  Time spent reviewing records from multiple providers, radiology studies and immunotherapy in addition to seeing and communicating with patient today is 30 minutes.  Suggested that she message Dr. Orvan Seen with her concerns.  I am comfortable with follow-up in 1 year as he suggested.

## 2020-01-14 NOTE — Patient Instructions (Addendum)
0.5ccTGW in left arm sq 0.5cc D-MDM in right arm sq No reaction, pt tolerated well  Continue antihypertensive medication.  Pulmonary nodules do not need further follow-up.  Dr. Orvan Seen recommends follow-up in 1 year but feel free to My Chart message him with your questions.  Allergy immunotherapy x2 today without issue.

## 2020-01-27 ENCOUNTER — Other Ambulatory Visit: Payer: Self-pay

## 2020-01-27 ENCOUNTER — Ambulatory Visit (INDEPENDENT_AMBULATORY_CARE_PROVIDER_SITE_OTHER): Payer: BC Managed Care – PPO | Admitting: Internal Medicine

## 2020-01-27 ENCOUNTER — Encounter: Payer: Self-pay | Admitting: Internal Medicine

## 2020-01-27 VITALS — BP 130/80 | HR 78 | Ht 65.0 in | Wt 154.0 lb

## 2020-01-27 DIAGNOSIS — Z298 Encounter for other specified prophylactic measures: Secondary | ICD-10-CM | POA: Diagnosis not present

## 2020-01-27 DIAGNOSIS — J3089 Other allergic rhinitis: Secondary | ICD-10-CM

## 2020-01-27 NOTE — Progress Notes (Signed)
Allergy injections given by CMA 

## 2020-01-27 NOTE — Patient Instructions (Signed)
0.5ccTGW in left arm sq 0.5cc D-MDM in right arm sq No reaction, pt tolerated well   

## 2020-02-10 ENCOUNTER — Other Ambulatory Visit: Payer: Self-pay

## 2020-02-10 ENCOUNTER — Ambulatory Visit (INDEPENDENT_AMBULATORY_CARE_PROVIDER_SITE_OTHER): Payer: BC Managed Care – PPO | Admitting: Internal Medicine

## 2020-02-10 ENCOUNTER — Encounter: Payer: Self-pay | Admitting: Internal Medicine

## 2020-02-10 VITALS — BP 120/80 | HR 75 | Ht 65.0 in | Wt 154.0 lb

## 2020-02-10 DIAGNOSIS — Z298 Encounter for other specified prophylactic measures: Secondary | ICD-10-CM

## 2020-02-10 DIAGNOSIS — J3089 Other allergic rhinitis: Secondary | ICD-10-CM

## 2020-02-10 NOTE — Progress Notes (Signed)
Allergy injections x 2 given by CMA. MJB. MD

## 2020-02-10 NOTE — Patient Instructions (Signed)
0.5ccTGW in left arm sq 0.5cc D-MDM in right arm sq No reaction, pt tolerated well   

## 2020-02-11 ENCOUNTER — Ambulatory Visit: Payer: BC Managed Care – PPO | Admitting: Internal Medicine

## 2020-02-27 ENCOUNTER — Ambulatory Visit (INDEPENDENT_AMBULATORY_CARE_PROVIDER_SITE_OTHER): Payer: BC Managed Care – PPO | Admitting: Internal Medicine

## 2020-02-27 ENCOUNTER — Other Ambulatory Visit: Payer: Self-pay

## 2020-02-27 VITALS — BP 130/80 | HR 78 | Temp 98.0°F | Ht 65.0 in | Wt 156.0 lb

## 2020-02-27 DIAGNOSIS — J302 Other seasonal allergic rhinitis: Secondary | ICD-10-CM | POA: Diagnosis not present

## 2020-02-27 DIAGNOSIS — Z298 Encounter for other specified prophylactic measures: Secondary | ICD-10-CM | POA: Diagnosis not present

## 2020-02-27 DIAGNOSIS — J3089 Other allergic rhinitis: Secondary | ICD-10-CM | POA: Diagnosis not present

## 2020-02-27 NOTE — Progress Notes (Signed)
Allergy injections per CMA

## 2020-02-27 NOTE — Patient Instructions (Signed)
0.5ccTGW in left arm sq 0.5cc D-MDM in right arm sq No reaction, pt tolerated well   

## 2020-03-12 ENCOUNTER — Ambulatory Visit: Payer: BC Managed Care – PPO | Admitting: Internal Medicine

## 2020-03-17 ENCOUNTER — Ambulatory Visit (INDEPENDENT_AMBULATORY_CARE_PROVIDER_SITE_OTHER): Payer: BC Managed Care – PPO | Admitting: Internal Medicine

## 2020-03-17 ENCOUNTER — Other Ambulatory Visit: Payer: Self-pay

## 2020-03-17 ENCOUNTER — Encounter: Payer: Self-pay | Admitting: Internal Medicine

## 2020-03-17 VITALS — BP 130/90 | HR 72 | Temp 98.0°F | Ht 65.0 in | Wt 154.0 lb

## 2020-03-17 DIAGNOSIS — Z298 Encounter for other specified prophylactic measures: Secondary | ICD-10-CM

## 2020-03-17 DIAGNOSIS — J3089 Other allergic rhinitis: Secondary | ICD-10-CM

## 2020-03-17 NOTE — Progress Notes (Signed)
Allergy injections per CMA 

## 2020-03-17 NOTE — Patient Instructions (Signed)
0.5ccTGW in left arm sq 0.5cc D-MDM in right arm sq No reaction, pt tolerated well   

## 2020-03-31 ENCOUNTER — Ambulatory Visit (INDEPENDENT_AMBULATORY_CARE_PROVIDER_SITE_OTHER): Payer: BC Managed Care – PPO | Admitting: Internal Medicine

## 2020-03-31 ENCOUNTER — Other Ambulatory Visit: Payer: Self-pay

## 2020-03-31 ENCOUNTER — Encounter: Payer: Self-pay | Admitting: Internal Medicine

## 2020-03-31 DIAGNOSIS — I10 Essential (primary) hypertension: Secondary | ICD-10-CM | POA: Diagnosis not present

## 2020-03-31 DIAGNOSIS — Z298 Encounter for other specified prophylactic measures: Secondary | ICD-10-CM | POA: Diagnosis not present

## 2020-03-31 NOTE — Patient Instructions (Addendum)
Increase Diovan to 80 mg daily q am. Monitor BP at home, Consider purchase of OMRON home BP monitor. BP flucuates with stress, anxiety, activity. It is not always the same value from hour to hour but generally fine when asleep.Allergy injections given per CMA RTC in 2 weeks for BP check by CMA and allergy injections. She does not need refill on Diovan now. Can double the 40 mg dose.

## 2020-04-13 DIAGNOSIS — J3089 Other allergic rhinitis: Secondary | ICD-10-CM | POA: Diagnosis not present

## 2020-04-13 DIAGNOSIS — H1045 Other chronic allergic conjunctivitis: Secondary | ICD-10-CM | POA: Diagnosis not present

## 2020-04-13 DIAGNOSIS — J301 Allergic rhinitis due to pollen: Secondary | ICD-10-CM | POA: Diagnosis not present

## 2020-04-16 ENCOUNTER — Ambulatory Visit (INDEPENDENT_AMBULATORY_CARE_PROVIDER_SITE_OTHER): Payer: BC Managed Care – PPO | Admitting: Internal Medicine

## 2020-04-16 ENCOUNTER — Other Ambulatory Visit: Payer: Self-pay

## 2020-04-16 ENCOUNTER — Encounter: Payer: Self-pay | Admitting: Internal Medicine

## 2020-04-16 VITALS — BP 110/80 | HR 74 | Temp 98.1°F | Ht 64.0 in | Wt 151.0 lb

## 2020-04-16 DIAGNOSIS — J301 Allergic rhinitis due to pollen: Secondary | ICD-10-CM | POA: Diagnosis not present

## 2020-04-16 DIAGNOSIS — J3089 Other allergic rhinitis: Secondary | ICD-10-CM

## 2020-04-16 NOTE — Progress Notes (Signed)
   Subjective:    Patient ID: Ann Chambers, female    DOB: 01/12/1961, 59 y.o.   MRN: [id number]  HPI Here for allergy injections per CMA     Review of Systems     Objective:   Physical Exam        Assessment & Plan:

## 2020-04-16 NOTE — Patient Instructions (Signed)
0.5ccTGW in left arm sq 0.5cc D-MDM in right arm sq No reaction, pt tolerated well   

## 2020-04-17 DIAGNOSIS — J3089 Other allergic rhinitis: Secondary | ICD-10-CM | POA: Diagnosis not present

## 2020-04-25 NOTE — Progress Notes (Signed)
   Subjective:    Patient ID: Ann Chambers, female    DOB: [birth year], 59 y.o.   MRN: [id number]  HPI 59 year old Female with history of essential hypertension treated with Diovan 40 mg daily.  History of allergic rhinitis and receives allergy injections for environmental and seasonal allergies regularly.  She has noticed some lability in her blood pressure recently and is concerned.  Only taking 40 mg of Diovan daily which is not a lot of blood pressure medication at all.  She has a history of ascending aortic aneurysm noted on CT scanning which was done to evaluate a cough.  The aneurysm was incidentally discovered and measured approximately 43 mm in diameter.  She has been seen by cardiothoracic surgeon.  Has no symptoms of chest pain or shortness of breath and is quite active.  She does not smoke.  Repeat study has been recommended by cardiovascular surgeon next year.  There are also stable bilateral pulmonary nodules largest being 4 mm in the right middle lobe.  These were noted to be benign by the radiologist with no further follow-up required.  Patient was concerned about these as well because sister has history of lung cancer.    Review of Systems she feels well and has no new complaints     Objective:   Physical Exam Blood pressure is excellent today at 110/80 BMI is 25.13 pulse 82 and regular temperature 98 degrees orally pulse oximetry 99% weight 151 pounds  Neck is supple without JVD thyromegaly or carotid bruits.  Chest clear to auscultation.  No murmurs rubs or gallops noted on cardiac exam.  Regular rate and rhythm noted.  No lower extremity pitting edema.       Assessment & Plan:  Although blood pressure may be labile at times, it is very stable and under good control.  Explained that blood pressure is not static but will change with activity and emotion.  The important thing is that blood pressure is not staying elevated.  In my opinion, her blood pressure is certainly  under excellent control today on current regimen.  However if she continues to note lability with blood pressure readings, she may increase Diovan to 80 mg daily.  Consider purchasing Omron home blood pressure monitor which is a good brand and reliable..  Patient will let me know if blood pressure continues to concern her.

## 2020-05-01 ENCOUNTER — Other Ambulatory Visit: Payer: Self-pay

## 2020-05-01 ENCOUNTER — Ambulatory Visit (INDEPENDENT_AMBULATORY_CARE_PROVIDER_SITE_OTHER): Payer: BC Managed Care – PPO | Admitting: Internal Medicine

## 2020-05-01 ENCOUNTER — Encounter: Payer: Self-pay | Admitting: Internal Medicine

## 2020-05-01 VITALS — BP 120/90 | HR 65 | Temp 97.9°F | Ht 65.0 in | Wt 149.0 lb

## 2020-05-01 DIAGNOSIS — I77810 Thoracic aortic ectasia: Secondary | ICD-10-CM

## 2020-05-01 DIAGNOSIS — I10 Essential (primary) hypertension: Secondary | ICD-10-CM | POA: Diagnosis not present

## 2020-05-01 DIAGNOSIS — J3089 Other allergic rhinitis: Secondary | ICD-10-CM | POA: Diagnosis not present

## 2020-05-01 NOTE — Progress Notes (Signed)
   Subjective:    Patient ID: Ann Chambers, female    DOB: 1961-01-02, 59 y.o.   MRN: [id number]  HPI 59 year old Female here today for immunotherapy.  She has environmental and seasonal allergies and takes allergy injections every 2 weeks.  However she has been concerned about fluctuating blood pressure readings.  Blood pressure is 120/90 today.  She has been taking Diovan 40 mg daily for some time for hypertension.  However she is concerned about 4 cm ectatic ascending thoracic aorta.  Has consulted with CV surgery about this who advised close observation.    Review of Systems see above-she has no chest pain or shortness of breath.     Objective:   Physical Exam  Blood pressure 120/90 pulse 65 temperature 97.9 degrees pulse oximetry 98% weight 149 pounds.  Chest is clear to auscultation.  Cardiac exam regular rate and rhythm.  No lower extremity pitting edema.      Assessment & Plan:  Environmental and seasonal allergies  4 cm thoracic ascending aorta which is being watched  Essential hypertension-treated with Diovan currently 40 mg daily  Plan: Continue to monitor blood pressure and make sure both systolic and diastolic readings are under control.  Continue immunotherapy for allergic rhinitis every 2 weeks.

## 2020-05-01 NOTE — Patient Instructions (Addendum)
0.5ccTGW in left arm sq 0.5cc D-MDM in right arm sq No reaction, pt tolerated well  Continue to monitor blood pressure at home and see if it will improve in January since it is a stressful season for you. It is a pleasure to see you today.

## 2020-05-07 ENCOUNTER — Other Ambulatory Visit: Payer: Self-pay | Admitting: Internal Medicine

## 2020-05-11 ENCOUNTER — Other Ambulatory Visit: Payer: Self-pay | Admitting: Internal Medicine

## 2020-05-15 ENCOUNTER — Other Ambulatory Visit: Payer: Self-pay

## 2020-05-15 ENCOUNTER — Ambulatory Visit (INDEPENDENT_AMBULATORY_CARE_PROVIDER_SITE_OTHER): Payer: BC Managed Care – PPO | Admitting: Internal Medicine

## 2020-05-15 VITALS — BP 138/88 | HR 82 | Ht 65.0 in | Wt 150.0 lb

## 2020-05-15 DIAGNOSIS — J301 Allergic rhinitis due to pollen: Secondary | ICD-10-CM | POA: Insufficient documentation

## 2020-05-15 DIAGNOSIS — H1045 Other chronic allergic conjunctivitis: Secondary | ICD-10-CM | POA: Insufficient documentation

## 2020-05-15 DIAGNOSIS — J3089 Other allergic rhinitis: Secondary | ICD-10-CM

## 2020-05-15 MED ORDER — VALSARTAN 80 MG PO TABS
80.0000 mg | ORAL_TABLET | Freq: Every day | ORAL | 1 refills | Status: DC
Start: 2020-05-15 — End: 2020-05-28

## 2020-05-15 NOTE — Progress Notes (Signed)
0.5ccTGW in left arm sq 0.5cc D-MDM in right arm sq No reaction, pt tolerated well  For allergy injections by CMA. MJB, MD

## 2020-05-17 ENCOUNTER — Encounter: Payer: Self-pay | Admitting: Internal Medicine

## 2020-05-28 ENCOUNTER — Ambulatory Visit: Payer: BC Managed Care – PPO | Admitting: Internal Medicine

## 2020-05-28 ENCOUNTER — Encounter: Payer: Self-pay | Admitting: Internal Medicine

## 2020-05-28 ENCOUNTER — Telehealth: Payer: Self-pay | Admitting: Internal Medicine

## 2020-05-28 ENCOUNTER — Ambulatory Visit (INDEPENDENT_AMBULATORY_CARE_PROVIDER_SITE_OTHER): Payer: BC Managed Care – PPO | Admitting: Internal Medicine

## 2020-05-28 ENCOUNTER — Other Ambulatory Visit: Payer: Self-pay

## 2020-05-28 VITALS — BP 110/86 | HR 72 | Temp 98.0°F | Ht 62.25 in | Wt 151.0 lb

## 2020-05-28 DIAGNOSIS — I10 Essential (primary) hypertension: Secondary | ICD-10-CM | POA: Diagnosis not present

## 2020-05-28 DIAGNOSIS — J3089 Other allergic rhinitis: Secondary | ICD-10-CM | POA: Diagnosis not present

## 2020-05-28 MED ORDER — VALSARTAN 80 MG PO TABS: 80.0000 mg | ORAL_TABLET | Freq: Every day | ORAL | 1 refills | Status: DC

## 2020-05-28 NOTE — Progress Notes (Signed)
Immunotherpay for allergies to day by CMA. BP is stable on current regimen. MJB,MD

## 2020-05-28 NOTE — Telephone Encounter (Signed)
Sent refill on Diovan 80 mg daily. Had acceptable BP check today with allergy injections. #90 Sig: one po daily. MJB, MD

## 2020-05-28 NOTE — Patient Instructions (Addendum)
0.5ccTGW in left arm sq 0.5cc D-MDM in right arm sq No reaction, pt tolerated   BP stable on current regimen. Continue to monitor at home.

## 2020-06-11 ENCOUNTER — Other Ambulatory Visit: Payer: Self-pay

## 2020-06-11 ENCOUNTER — Encounter: Payer: Self-pay | Admitting: Internal Medicine

## 2020-06-11 ENCOUNTER — Ambulatory Visit (INDEPENDENT_AMBULATORY_CARE_PROVIDER_SITE_OTHER): Payer: BC Managed Care – PPO | Admitting: Internal Medicine

## 2020-06-11 VITALS — BP 120/88 | HR 78 | Temp 98.1°F | Ht 62.25 in | Wt 151.0 lb

## 2020-06-11 DIAGNOSIS — Z298 Encounter for other specified prophylactic measures: Secondary | ICD-10-CM

## 2020-06-11 DIAGNOSIS — J3089 Other allergic rhinitis: Secondary | ICD-10-CM

## 2020-06-11 NOTE — Patient Instructions (Addendum)
0.5ccTGW in left arm sq 0.5cc D-MDM in right arm sq No reaction, pt tolerated well   

## 2020-06-11 NOTE — Progress Notes (Signed)
   Subjective:    Patient ID: Ann Chambers, female    DOB: March 11, 1961, 60 y.o.   MRN: [id number]  HPI Allergy injections per CMA    Review of Systems     Objective:   Physical Exam        Assessment & Plan:

## 2020-06-18 ENCOUNTER — Ambulatory Visit: Payer: BC Managed Care – PPO | Admitting: Internal Medicine

## 2020-06-19 ENCOUNTER — Ambulatory Visit (INDEPENDENT_AMBULATORY_CARE_PROVIDER_SITE_OTHER): Payer: BC Managed Care – PPO | Admitting: Internal Medicine

## 2020-06-19 ENCOUNTER — Other Ambulatory Visit: Payer: Self-pay

## 2020-06-19 DIAGNOSIS — Z298 Encounter for other specified prophylactic measures: Secondary | ICD-10-CM | POA: Diagnosis not present

## 2020-06-19 DIAGNOSIS — J301 Allergic rhinitis due to pollen: Secondary | ICD-10-CM | POA: Diagnosis not present

## 2020-06-19 NOTE — Progress Notes (Signed)
Allergy injections per CMA 

## 2020-06-19 NOTE — Patient Instructions (Signed)
0.1ccTGW in left arm sq 0.1cc D-MDM in right arm sq No reaction, pt toleratedwell

## 2020-06-21 ENCOUNTER — Encounter: Payer: Self-pay | Admitting: Internal Medicine

## 2020-06-25 ENCOUNTER — Ambulatory Visit (INDEPENDENT_AMBULATORY_CARE_PROVIDER_SITE_OTHER): Payer: BC Managed Care – PPO | Admitting: Internal Medicine

## 2020-06-25 ENCOUNTER — Encounter: Payer: Self-pay | Admitting: Internal Medicine

## 2020-06-25 ENCOUNTER — Other Ambulatory Visit: Payer: Self-pay

## 2020-06-25 VITALS — BP 120/70 | HR 78 | Temp 97.7°F | Ht 62.25 in | Wt 151.0 lb

## 2020-06-25 DIAGNOSIS — J301 Allergic rhinitis due to pollen: Secondary | ICD-10-CM

## 2020-06-25 DIAGNOSIS — J3089 Other allergic rhinitis: Secondary | ICD-10-CM

## 2020-06-25 NOTE — Progress Notes (Signed)
Allergy injections per CMA 

## 2020-06-25 NOTE — Patient Instructions (Addendum)
0.2ccTGW in left arm sq 0.2cc D-MDM in right arm sq No reaction, pt tolerated well 

## 2020-06-28 ENCOUNTER — Encounter: Payer: Self-pay | Admitting: Internal Medicine

## 2020-06-28 NOTE — Progress Notes (Signed)
   Subjective:    Patient ID: Ann Chambers, female    DOB: 11/25/1960, 60 y.o.   MRN: [id number]  HPI   Allergy injections given by CMA today.      Review of Systems     Objective:   Physical Exam        Assessment & Plan:  Environmental and seasonal allergies  RTC in 2 weeks  MJB, MD

## 2020-06-28 NOTE — Patient Instructions (Signed)
Allergy injections per CMA

## 2020-07-02 ENCOUNTER — Encounter: Payer: Self-pay | Admitting: Internal Medicine

## 2020-07-02 ENCOUNTER — Ambulatory Visit (INDEPENDENT_AMBULATORY_CARE_PROVIDER_SITE_OTHER): Payer: BC Managed Care – PPO | Admitting: Internal Medicine

## 2020-07-02 ENCOUNTER — Other Ambulatory Visit: Payer: Self-pay

## 2020-07-02 DIAGNOSIS — J301 Allergic rhinitis due to pollen: Secondary | ICD-10-CM | POA: Diagnosis not present

## 2020-07-02 NOTE — Progress Notes (Signed)
Allergy injections per CMA

## 2020-07-02 NOTE — Patient Instructions (Signed)
0.3ccTGW in left arm sq 0.3cc D-MDM in right arm sq No reaction, pt tolerated well 

## 2020-07-16 ENCOUNTER — Encounter: Payer: Self-pay | Admitting: Internal Medicine

## 2020-07-16 ENCOUNTER — Other Ambulatory Visit: Payer: Self-pay

## 2020-07-16 ENCOUNTER — Ambulatory Visit (INDEPENDENT_AMBULATORY_CARE_PROVIDER_SITE_OTHER): Payer: BC Managed Care – PPO | Admitting: Internal Medicine

## 2020-07-16 VITALS — BP 110/80 | Temp 97.9°F | Ht 62.25 in | Wt 148.1 lb

## 2020-07-16 DIAGNOSIS — I10 Essential (primary) hypertension: Secondary | ICD-10-CM | POA: Diagnosis not present

## 2020-07-16 DIAGNOSIS — Z298 Encounter for other specified prophylactic measures: Secondary | ICD-10-CM | POA: Diagnosis not present

## 2020-07-16 NOTE — Progress Notes (Signed)
   Subjective:    Patient ID: Ann Chambers, female    DOB: [birth year], 60 y.o.   MRN: [id number]  HPI Allergy injections per CMA. Patient says BP has been quite good. Now taking BP med at night. See BP reading today which is stable.     Review of Systems     Objective:   Physical Exam        Assessment & Plan:

## 2020-07-16 NOTE — Patient Instructions (Signed)
0.5ccTGW in left arm sq 0.5cc D-MDM in right arm sq No reaction, pt tolerated well   

## 2020-07-30 ENCOUNTER — Ambulatory Visit (INDEPENDENT_AMBULATORY_CARE_PROVIDER_SITE_OTHER): Payer: BC Managed Care – PPO | Admitting: Internal Medicine

## 2020-07-30 ENCOUNTER — Other Ambulatory Visit: Payer: Self-pay

## 2020-07-30 ENCOUNTER — Encounter: Payer: Self-pay | Admitting: Internal Medicine

## 2020-07-30 VITALS — BP 130/88 | HR 78 | Temp 97.9°F | Ht 62.25 in | Wt 151.0 lb

## 2020-07-30 DIAGNOSIS — J3089 Other allergic rhinitis: Secondary | ICD-10-CM

## 2020-07-30 NOTE — Progress Notes (Signed)
Allergy injections per CMA.  MJB,MD

## 2020-07-30 NOTE — Patient Instructions (Signed)
0.5ccTGW in left arm sq 0.5cc D-MDM in right arm sq No reaction, pt tolerated well   

## 2020-08-13 ENCOUNTER — Ambulatory Visit (INDEPENDENT_AMBULATORY_CARE_PROVIDER_SITE_OTHER): Payer: BC Managed Care – PPO | Admitting: Internal Medicine

## 2020-08-13 ENCOUNTER — Other Ambulatory Visit: Payer: Self-pay

## 2020-08-13 ENCOUNTER — Encounter: Payer: Self-pay | Admitting: Internal Medicine

## 2020-08-13 VITALS — BP 110/80 | HR 76 | Temp 98.3°F | Ht 62.25 in | Wt 149.0 lb

## 2020-08-13 DIAGNOSIS — J301 Allergic rhinitis due to pollen: Secondary | ICD-10-CM

## 2020-08-13 DIAGNOSIS — J3089 Other allergic rhinitis: Secondary | ICD-10-CM | POA: Diagnosis not present

## 2020-08-13 NOTE — Progress Notes (Signed)
Allergy injections per CMA 

## 2020-08-13 NOTE — Patient Instructions (Signed)
0.5ccTGW in left arm sq 0.5cc D-MDM in right arm sq No reaction, pt tolerated well   

## 2020-08-27 ENCOUNTER — Other Ambulatory Visit: Payer: Self-pay

## 2020-08-27 ENCOUNTER — Ambulatory Visit (INDEPENDENT_AMBULATORY_CARE_PROVIDER_SITE_OTHER): Payer: BC Managed Care – PPO | Admitting: Internal Medicine

## 2020-08-27 ENCOUNTER — Encounter: Payer: Self-pay | Admitting: Internal Medicine

## 2020-08-27 VITALS — BP 140/80 | HR 78 | Temp 98.0°F | Ht 62.25 in | Wt 150.0 lb

## 2020-08-27 DIAGNOSIS — J3089 Other allergic rhinitis: Secondary | ICD-10-CM | POA: Diagnosis not present

## 2020-08-27 NOTE — Patient Instructions (Signed)
0.5ccTGW in left arm sq 0.5cc D-MDM in right arm sq No reaction, pt tolerated well

## 2020-08-27 NOTE — Progress Notes (Signed)
Allergy injections per CMA.

## 2020-09-04 DIAGNOSIS — Z1231 Encounter for screening mammogram for malignant neoplasm of breast: Secondary | ICD-10-CM | POA: Diagnosis not present

## 2020-09-04 LAB — HM MAMMOGRAPHY

## 2020-09-08 ENCOUNTER — Encounter: Payer: Self-pay | Admitting: Internal Medicine

## 2020-09-10 ENCOUNTER — Encounter: Payer: Self-pay | Admitting: Internal Medicine

## 2020-09-10 ENCOUNTER — Other Ambulatory Visit: Payer: Self-pay

## 2020-09-10 ENCOUNTER — Ambulatory Visit (INDEPENDENT_AMBULATORY_CARE_PROVIDER_SITE_OTHER): Payer: BC Managed Care – PPO | Admitting: Internal Medicine

## 2020-09-10 VITALS — BP 130/80 | HR 78 | Temp 98.1°F | Ht 62.25 in | Wt 151.0 lb

## 2020-09-10 DIAGNOSIS — J3089 Other allergic rhinitis: Secondary | ICD-10-CM

## 2020-09-10 DIAGNOSIS — J301 Allergic rhinitis due to pollen: Secondary | ICD-10-CM

## 2020-09-10 DIAGNOSIS — Z298 Encounter for other specified prophylactic measures: Secondary | ICD-10-CM

## 2020-09-10 NOTE — Patient Instructions (Addendum)
0.5ccTGW in left arm sq 0.5cc D-MDM in right arm sq No reaction, pt tolerated well  RTC 2 weeks.

## 2020-09-10 NOTE — Progress Notes (Signed)
   Subjective:    Patient ID: Ann Chambers, female    DOB: 15-Jan-1961, 60 y.o.   MRN: [id number]  HPI For allergy injections per CMA. Scheduled for additional mammogram imaging per [facility].    Review of Systems     Objective:   Physical Exam        Assessment & Plan:

## 2020-09-16 ENCOUNTER — Encounter: Payer: Self-pay | Admitting: Internal Medicine

## 2020-09-16 DIAGNOSIS — R922 Inconclusive mammogram: Secondary | ICD-10-CM | POA: Diagnosis not present

## 2020-09-16 DIAGNOSIS — R928 Other abnormal and inconclusive findings on diagnostic imaging of breast: Secondary | ICD-10-CM | POA: Diagnosis not present

## 2020-09-24 ENCOUNTER — Other Ambulatory Visit: Payer: Self-pay

## 2020-09-24 ENCOUNTER — Other Ambulatory Visit: Payer: BC Managed Care – PPO | Admitting: Internal Medicine

## 2020-09-24 DIAGNOSIS — Z8719 Personal history of other diseases of the digestive system: Secondary | ICD-10-CM

## 2020-09-24 DIAGNOSIS — J3089 Other allergic rhinitis: Secondary | ICD-10-CM

## 2020-09-24 DIAGNOSIS — Z1329 Encounter for screening for other suspected endocrine disorder: Secondary | ICD-10-CM

## 2020-09-24 DIAGNOSIS — J301 Allergic rhinitis due to pollen: Secondary | ICD-10-CM | POA: Diagnosis not present

## 2020-09-24 DIAGNOSIS — R911 Solitary pulmonary nodule: Secondary | ICD-10-CM

## 2020-09-24 DIAGNOSIS — E559 Vitamin D deficiency, unspecified: Secondary | ICD-10-CM

## 2020-09-24 DIAGNOSIS — I10 Essential (primary) hypertension: Secondary | ICD-10-CM | POA: Diagnosis not present

## 2020-09-24 DIAGNOSIS — I77810 Thoracic aortic ectasia: Secondary | ICD-10-CM

## 2020-09-24 DIAGNOSIS — Z0000 Encounter for general adult medical examination without abnormal findings: Secondary | ICD-10-CM

## 2020-09-24 DIAGNOSIS — Z801 Family history of malignant neoplasm of trachea, bronchus and lung: Secondary | ICD-10-CM

## 2020-09-25 LAB — COMPLETE METABOLIC PANEL WITH GFR
AG Ratio: 1.9 (calc) (ref 1.0–2.5)
ALT: 19 U/L (ref 6–29)
AST: 19 U/L (ref 10–35)
Albumin: 4.3 g/dL (ref 3.6–5.1)
Alkaline phosphatase (APISO): 91 U/L (ref 37–153)
BUN: 18 mg/dL (ref 7–25)
CO2: 26 mmol/L (ref 20–32)
Calcium: 9.4 mg/dL (ref 8.6–10.4)
Chloride: 107 mmol/L (ref 98–110)
Creat: 0.66 mg/dL (ref 0.50–0.99)
GFR, Est African American: 110 mL/min/{1.73_m2} (ref >=60)
GFR, Est Non African American: 95 mL/min/{1.73_m2} (ref >=60)
Globulin: 2.3 g/dL (calc) (ref 1.9–3.7)
Glucose, Bld: 78 mg/dL (ref 65–99)
Potassium: 4.8 mmol/L (ref 3.5–5.3)
Sodium: 142 mmol/L (ref 135–146)
Total Bilirubin: 0.6 mg/dL (ref 0.2–1.2)
Total Protein: 6.6 g/dL (ref 6.1–8.1)

## 2020-09-25 LAB — CBC WITH DIFFERENTIAL/PLATELET
Absolute Monocytes: 465 cells/uL (ref 200–950)
Basophils Absolute: 50 cells/uL (ref 0–200)
Basophils Relative: 0.8 %
Eosinophils Absolute: 143 cells/uL (ref 15–500)
Eosinophils Relative: 2.3 %
HCT: 37.9 % (ref 35.0–45.0)
Hemoglobin: 12.4 g/dL (ref 11.7–15.5)
Lymphs Abs: 1370 cells/uL (ref 850–3900)
MCH: 29.9 pg (ref 27.0–33.0)
MCHC: 32.7 g/dL (ref 32.0–36.0)
MCV: 91.3 fL (ref 80.0–100.0)
MPV: 9.8 fL (ref 7.5–12.5)
Monocytes Relative: 7.5 %
Neutro Abs: 4173 cells/uL (ref 1500–7800)
Neutrophils Relative %: 67.3 %
Platelets: 264 10*3/uL (ref 140–400)
RBC: 4.15 10*6/uL (ref 3.80–5.10)
RDW: 12.1 % (ref 11.0–15.0)
Total Lymphocyte: 22.1 %
WBC: 6.2 10*3/uL (ref 3.8–10.8)

## 2020-09-25 LAB — TSH: TSH: 1.45 mIU/L (ref 0.40–4.50)

## 2020-09-25 LAB — LIPID PANEL
Cholesterol: 181 mg/dL (ref <200)
HDL: 93 mg/dL (ref >=50)
LDL Cholesterol (Calc): 76 mg/dL (calc)
Non-HDL Cholesterol (Calc): 88 mg/dL (calc) (ref <130)
Total CHOL/HDL Ratio: 1.9 (calc) (ref <5.0)
Triglycerides: 43 mg/dL (ref <150)

## 2020-09-25 LAB — VITAMIN D 25 HYDROXY (VIT D DEFICIENCY, FRACTURES): Vit D, 25-Hydroxy: 41 ng/mL (ref 30–100)

## 2020-09-28 ENCOUNTER — Encounter: Payer: Self-pay | Admitting: Internal Medicine

## 2020-09-28 ENCOUNTER — Ambulatory Visit (INDEPENDENT_AMBULATORY_CARE_PROVIDER_SITE_OTHER): Payer: BC Managed Care – PPO | Admitting: Internal Medicine

## 2020-09-28 ENCOUNTER — Other Ambulatory Visit: Payer: Self-pay

## 2020-09-28 VITALS — BP 140/90 | HR 66 | Ht 62.25 in | Wt 154.0 lb

## 2020-09-28 DIAGNOSIS — Z803 Family history of malignant neoplasm of breast: Secondary | ICD-10-CM

## 2020-09-28 DIAGNOSIS — I77810 Thoracic aortic ectasia: Secondary | ICD-10-CM | POA: Diagnosis not present

## 2020-09-28 DIAGNOSIS — Z801 Family history of malignant neoplasm of trachea, bronchus and lung: Secondary | ICD-10-CM

## 2020-09-28 DIAGNOSIS — J302 Other seasonal allergic rhinitis: Secondary | ICD-10-CM | POA: Diagnosis not present

## 2020-09-28 DIAGNOSIS — I10 Essential (primary) hypertension: Secondary | ICD-10-CM

## 2020-09-28 DIAGNOSIS — Z0000 Encounter for general adult medical examination without abnormal findings: Secondary | ICD-10-CM | POA: Diagnosis not present

## 2020-09-28 DIAGNOSIS — Z8719 Personal history of other diseases of the digestive system: Secondary | ICD-10-CM | POA: Diagnosis not present

## 2020-09-28 DIAGNOSIS — Z87898 Personal history of other specified conditions: Secondary | ICD-10-CM

## 2020-09-28 DIAGNOSIS — Z9109 Other allergy status, other than to drugs and biological substances: Secondary | ICD-10-CM

## 2020-09-28 LAB — POCT URINALYSIS DIPSTICK
Appearance: NEGATIVE
Bilirubin, UA: NEGATIVE
Blood, UA: NEGATIVE
Glucose, UA: NEGATIVE
Ketones, UA: NEGATIVE
Leukocytes, UA: NEGATIVE
Nitrite, UA: NEGATIVE
Odor: NEGATIVE
Protein, UA: NEGATIVE
Spec Grav, UA: 1.015 (ref 1.010–1.025)
Urobilinogen, UA: 0.2 E.U./dL
pH, UA: 6.5 (ref 5.0–8.0)

## 2020-09-28 NOTE — Progress Notes (Signed)
Subjective:    Patient ID: Ann Chambers, female    DOB: 1960-10-28, 60 y.o.   MRN: [id number]  HPI 60 year old Female seen for health maintenance exam and evaluation of medical issues.  She has a history of essential hypertension treated with Diovan (valsartan).   In April 2021, had mild bout of diverticulitis over the Easter holiday.  History of diverticulitis in 2018.  Patient had colonoscopy in 2018 by Dr. Cristina Gong which was normal with 10-year follow-up recommended.  DEXA scan 2015 was normal.  She has a family history of lung cancer in her sister who is a non-smoker.  Patient had CT calcium score in April 2018.  Score was 0.  However, there were 2 small pulmonary nodules.  CT of the chest without contrast was repeated November 20, 2019.  3 mm calcified nodule was seen left upper lobe.  Mild biapical scarring was noted.  Stable 3 mm subpleural nodule medially along the medial pleural surface of the left upper lobe.  Stable 4 mm nodule noted right middle lobe.  In June 2021, she was referred to Dr. Julien Girt at [facility] for evaluation of an incidental discovery of ascending aorta measuring 39 mm upper normal size on CT calcium score exam and he felt there were no worrisome features associated with this finding but suggested repeat CT in one year. Referral will be made back to Dr. Orvan Seen as she still has concerns about this finding.  She also saw Dr. Lamonte Sakai, Pulmonologist, for evaluation of Pulmonary nodules. Dr. Lamonte Sakai felt the nodules were stable from 2018. He did not feel further evaluation such as serial CTs were necessary.  Mammogram in March 2020.  There was concern regarding a palpable lump at 4:00 posteriorly.  Was done revealing a 2.8 in the left breast at 3:00 10 cm from the nipple.  She does not take flu vaccine due to history of egg allergy although reaction is only nausea.  Says she is only had flu and works and that was when she was a child.  History of migraine  headaches.  History of scoliosis and receives massage therapy for issues with back pain.  In June 2016 she saw Dr. Tonia Brooms for annual skin checkup.  There was an area in the right lateral lower thigh which was thought to be a lipoma and it was removed.  Pathology showed adipose tissue consistent with lipoma.  No atypia was observed.  No known drug allergies.  In 2015 she had chest pain, an EKG was normal.  Had appendectomy at [facility] in 1996.  Right shoulder surgery in 2002 in 2005.  History of lactose intolerance and irritable bowel syndrome.  She was seen in the emergency department with acute abdominal pain December 1997.  CT showed thickening of the midportion of the ascending colon.  Subsequently had colonoscopy in 1998 which was normal.  She was seen by Dr. Cristina Gong at that time.  She had genetic testing at cancer center in October 2019.  She was heterozygous for MUTYH.  Was thought to have moderate increased risk for colon polyps but has had negative colonoscopies.  No other hereditary predisposition to cancer was identified with her consultation.  Social history: Married.  No children.  She is employed as a Academic librarian.  Husband is a Psychiatric nurse.  She does not smoke.  Social alcohol consumption.  She watches her weight and stays in good shape.  She goes to the gym.  She has environmental  and seasonal allergies and receives immunotherapy here approximately every 2 weeks.  Her allergist is [place].  She has environmental and seasonal allergies.  Review of Systems  Constitutional: Negative.   Respiratory: Negative.   Cardiovascular: Negative.   Gastrointestinal: Negative.   Genitourinary: Negative.   Musculoskeletal: Negative.   Neurological:       Recent episode of vertigo thought related to allergies/working in yard-improved with antihistamine  Psychiatric/Behavioral: Negative.        Objective:   Physical Exam Blood pressure  140/90-usually runs better than this pulse 66 pulse oximetry 98% weight 154 pounds BMI 27.97 skin: Warm and dry.  Nodes none.  Neck is supple.  No JVD thyromegaly or carotid bruits.  Chest clear to auscultation.  Cardiac exam: Regular rate and rhythm normal S1 and S2 without murmurs.  Abdomen soft nondistended.  No bruits.  No masses or tenderness.  No hepatosplenomegaly.  No lower extremity pitting edema.  Affect thought and judgment are normal.  GYN exam deferred to GYN physician.       Assessment & Plan:  History of benign pulmonary nodules seen by Dr. Candiss Norse last year and follow-up no further testing was necessary  Upper limits of normal with ascending aorta- would like for her to see Dr. Julien Girt once again to discuss any concerns and recommended follow-up  Essential hypertension-continue to monitor and let me know if persistently elevated.  Would like blood pressure to be less than 140/90  Environmental and seasonal allergies on immunotherapy  History of diverticulitis  Status post appendectomy  History of scoliosis  History of migraine headaches  History of egg allergy  History of MUTYH gene  Family history of lung cancer in her sister.  Plan: Follow-up here in 6 months but will continue to get environmental and seasonal immunotherapy every 2 weeks here.  Referral back to Dr. Julien Girt for consultation and reassurance.

## 2020-10-21 ENCOUNTER — Telehealth: Payer: Self-pay | Admitting: Internal Medicine

## 2020-10-21 ENCOUNTER — Other Ambulatory Visit: Payer: Self-pay

## 2020-10-21 ENCOUNTER — Telehealth (INDEPENDENT_AMBULATORY_CARE_PROVIDER_SITE_OTHER): Payer: BC Managed Care – PPO | Admitting: Internal Medicine

## 2020-10-21 ENCOUNTER — Encounter: Payer: Self-pay | Admitting: Internal Medicine

## 2020-10-21 VITALS — BP 130/91 | Temp 97.7°F

## 2020-10-21 DIAGNOSIS — U071 COVID-19: Secondary | ICD-10-CM | POA: Diagnosis not present

## 2020-10-21 MED ORDER — AZITHROMYCIN 250 MG PO TABS: ORAL_TABLET | ORAL | 0 refills | Status: AC

## 2020-10-21 NOTE — Patient Instructions (Signed)
Obtain pulse oximetry device and monitor pulse oxygen level at home.  Walk outside to keep lungs aerated.  Take Zithromax Z-PAK 2 tablets day 1 followed by 1 tab days 2 through 5.  Rest and drink plenty of fluids.  Quarantine for 5 to 7 days.

## 2020-10-21 NOTE — Telephone Encounter (Signed)
Ann Chambers [phone]  Ann Chambers called to say she started having runny nose and some congestion on Sunday and she tested positive for COVID with home test last night. They had been to [facility] for [employer]. No Fever, Has had 2 vaccines.

## 2020-10-21 NOTE — Telephone Encounter (Signed)
Virtual visit completed.

## 2020-10-21 NOTE — Telephone Encounter (Signed)
Scheduled

## 2020-10-21 NOTE — Progress Notes (Signed)
   Subjective:    Patient ID: MARIJAH LARRANAGA, female    DOB: [birth year], 61 y.o.   MRN: [id number]  HPI 60 year old Female flew on commercial airliner to and from [place] to [place] to the  attend [place] [place] this past weekend. Last night, she felt nasally congested.  She did a home COVID test and it was strongly positive.  She is surprised that she feels fairly well.  She has no fever, shaking chills, nausea, vomiting, diarrhea or dysgeusia.  Only complaint is nasal congestion.  Her blood pressures been a little elevated in the 130/90 range.  She denies shortness of breath or chest pain.  She is seen via interactive audio and video telecommunications due to the Coronavirus pandemic.  She is identified using 2 identifiers as Porschea T. Aggie Cosier, a patient in this practice.  She is agreeable to visit in this format today.  She is at home and I am at my office.  Her symptoms are described above.  She has a history of hypertension and allergic rhinitis for which she receives allergy immunotherapy.       Review of Systems see above     Objective:   Physical Exam  She is seen virtually today in no acute distress.      Assessment & Plan:  Acute COVID-19 virus infection-patient appears to have mild case at present time.  Plan: Since she is complaining of nasal congestion, she may be developing an early acute maxillary sinusitis so I am placing her on Zithromax Z-PAK 2 tablets day 1 followed by 1 tab days 2 through 5.  Rest at home and drink plenty of fluids.  Will need to quarantine 5 to 7 days.  Monitor pulse oximetry at home and walk to prevent atelectasis of lungs.  Call if symptoms worsen or she has any concerns at all.

## 2020-10-26 NOTE — Patient Instructions (Signed)
Continue with allergy immunotherapy every 2 weeks.  Referral back to Dr. Julien Girt for review and reassurance of ascending aorta issue.  Pulmonary consultation felt not to be necessary at this time per Dr. Lamonte Sakai.  Monitor blood pressure at home and let me know if persistently elevated on current medication.  Return in 6 months for office visit blood pressure check and further discussion

## 2020-10-31 ENCOUNTER — Other Ambulatory Visit: Payer: Self-pay | Admitting: Internal Medicine

## 2020-11-18 ENCOUNTER — Ambulatory Visit (INDEPENDENT_AMBULATORY_CARE_PROVIDER_SITE_OTHER): Payer: BC Managed Care – PPO | Admitting: Internal Medicine

## 2020-11-18 ENCOUNTER — Other Ambulatory Visit: Payer: Self-pay

## 2020-11-18 ENCOUNTER — Other Ambulatory Visit: Payer: Self-pay | Admitting: Cardiothoracic Surgery

## 2020-11-18 VITALS — BP 140/90 | HR 78 | Temp 98.1°F | Ht 62.25 in | Wt 154.0 lb

## 2020-11-18 DIAGNOSIS — J3089 Other allergic rhinitis: Secondary | ICD-10-CM

## 2020-11-18 DIAGNOSIS — Z298 Encounter for other specified prophylactic measures: Secondary | ICD-10-CM | POA: Diagnosis not present

## 2020-11-18 DIAGNOSIS — I712 Thoracic aortic aneurysm, without rupture, unspecified: Secondary | ICD-10-CM

## 2020-11-18 DIAGNOSIS — J302 Other seasonal allergic rhinitis: Secondary | ICD-10-CM

## 2020-11-18 DIAGNOSIS — Z9109 Other allergy status, other than to drugs and biological substances: Secondary | ICD-10-CM

## 2020-11-18 NOTE — Patient Instructions (Signed)
0.5cc TGW in left arm sq 0.5cc D-MDM in right arm sq No reaction, pt tolerated well   RTC 2 weeks.

## 2020-11-18 NOTE — Progress Notes (Signed)
Allergy injections per CMA

## 2020-12-03 ENCOUNTER — Encounter: Payer: Self-pay | Admitting: Internal Medicine

## 2020-12-03 ENCOUNTER — Ambulatory Visit (INDEPENDENT_AMBULATORY_CARE_PROVIDER_SITE_OTHER): Payer: BC Managed Care – PPO | Admitting: Internal Medicine

## 2020-12-03 ENCOUNTER — Other Ambulatory Visit: Payer: Self-pay

## 2020-12-03 VITALS — BP 110/80 | HR 74 | Ht 62.25 in | Wt 154.0 lb

## 2020-12-03 DIAGNOSIS — J302 Other seasonal allergic rhinitis: Secondary | ICD-10-CM

## 2020-12-03 DIAGNOSIS — J301 Allergic rhinitis due to pollen: Secondary | ICD-10-CM | POA: Diagnosis not present

## 2020-12-03 DIAGNOSIS — Z9109 Other allergy status, other than to drugs and biological substances: Secondary | ICD-10-CM

## 2020-12-03 DIAGNOSIS — Z298 Encounter for other specified prophylactic measures: Secondary | ICD-10-CM | POA: Diagnosis not present

## 2020-12-03 NOTE — Patient Instructions (Signed)
0.5cc TGW in left arm sq 0.5cc D-MDM in right arm sq No reaction, pt tolerated well   RTC 2 weeks.

## 2020-12-03 NOTE — Progress Notes (Signed)
Immunotherapy for allergies given by CMA

## 2020-12-16 ENCOUNTER — Encounter: Payer: Self-pay | Admitting: Internal Medicine

## 2020-12-16 ENCOUNTER — Ambulatory Visit (INDEPENDENT_AMBULATORY_CARE_PROVIDER_SITE_OTHER): Payer: BC Managed Care – PPO | Admitting: Internal Medicine

## 2020-12-16 ENCOUNTER — Other Ambulatory Visit: Payer: Self-pay

## 2020-12-16 VITALS — BP 110/80 | HR 78 | Ht 62.25 in | Wt 148.0 lb

## 2020-12-16 DIAGNOSIS — J3089 Other allergic rhinitis: Secondary | ICD-10-CM

## 2020-12-16 DIAGNOSIS — Z298 Encounter for other specified prophylactic measures: Secondary | ICD-10-CM

## 2020-12-16 DIAGNOSIS — Z9109 Other allergy status, other than to drugs and biological substances: Secondary | ICD-10-CM

## 2020-12-16 DIAGNOSIS — J302 Other seasonal allergic rhinitis: Secondary | ICD-10-CM

## 2020-12-16 NOTE — Progress Notes (Signed)
Allergy injections given by CMA

## 2020-12-16 NOTE — Patient Instructions (Signed)
0.5cc TGW in left arm sq 0.5cc D-MDM in right arm sq No reaction, pt tolerated well   RTC 2 weeks.

## 2020-12-17 ENCOUNTER — Ambulatory Visit (INDEPENDENT_AMBULATORY_CARE_PROVIDER_SITE_OTHER): Payer: BC Managed Care – PPO | Admitting: Cardiothoracic Surgery

## 2020-12-17 ENCOUNTER — Ambulatory Visit
Admission: RE | Admit: 2020-12-17 | Discharge: 2020-12-17 | Disposition: A | Discharge status: Home / Self Care | Payer: BC Managed Care – PPO | Source: Ambulatory Visit | Attending: Cardiothoracic Surgery | Admitting: Cardiothoracic Surgery

## 2020-12-17 VITALS — BP 113/74 | HR 87 | Resp 20 | Ht 62.25 in | Wt 148.0 lb

## 2020-12-17 DIAGNOSIS — I7121 Aneurysm of the ascending aorta, without rupture: Secondary | ICD-10-CM

## 2020-12-17 DIAGNOSIS — I712 Thoracic aortic aneurysm, without rupture, unspecified: Secondary | ICD-10-CM

## 2020-12-17 DIAGNOSIS — R911 Solitary pulmonary nodule: Secondary | ICD-10-CM | POA: Diagnosis not present

## 2020-12-17 DIAGNOSIS — R918 Other nonspecific abnormal finding of lung field: Secondary | ICD-10-CM | POA: Diagnosis not present

## 2020-12-17 NOTE — Progress Notes (Signed)
Chief complaint: Aneurysm surveillance  History of present illness: 60 year old lady presents for annual aneurysm follow-up.  She was first diagnosed with an aneurysm approximately 4 years ago and this was discovered incidentally.  She had an echocardiogram a year ago which demonstrated approximately 42 mm ascending aortic aneurysm.  Of note, she has a family history of aneurysm rupture and uncle and grandmother.  She has high blood pressure which is well controlled medically.  She has never smoked; she denies chest pain or shortness of breath.  She exercises regularly without difficulty  Active Ambulatory Problems    Diagnosis Date Noted   Allergic rhinitis 11/02/2010   Migraine headache 11/02/2010   Hypertension 01/10/2012   Diverticulosis 01/02/2017   Family history of lung cancer    Family history of prostate cancer    Genetic testing 02/77/4128   Monoallelic mutation of MUTYH gene 03/12/2018   Pulmonary nodules/lesions, multiple 12/12/2019   Allergic rhinitis due to pollen 05/15/2020   Chronic allergic conjunctivitis 05/15/2020   Resolved Ambulatory Problems    Diagnosis Date Noted   No Resolved Ambulatory Problems   Past Medical History:  Diagnosis Date   Egg allergy    Essential hypertension    Migraines    Scoliosis    Current Outpatient Medications on File Prior to Visit  Medication Sig Dispense Refill   ADVANCED EVENING PRIMROSE OIL PO Take 1,300 mg by mouth daily.     co-enzyme Q-10 30 MG capsule Take 100 mg by mouth daily.     ELDERBERRY PO Take 1 capsule by mouth daily. 400 mg Black Elderberry with Vitamin C 100 mg and Zinc 7.5mg      EPIPEN 2-PAK 0.3 MG/0.3ML DEVI      Misc Natural Products (TART CHERRY ADVANCED) CAPS Take 2 capsules by mouth daily.     Multiple Vitamins-Minerals (PRESERVISION AREDS 2) CAPS Take 2 capsules by mouth daily.     Omega 3 1200 MG CAPS Take by mouth.     valsartan (DIOVAN) 80 MG tablet TAKE 1 TABLET BY MOUTH EVERY DAY 90 tablet 1    VITAMIN D, CHOLECALCIFEROL, PO Take 2,000 Units by mouth.      No current facility-administered medications on file prior to visit.   Physical exam: BP 113/74   Pulse 87   Resp 20   Ht 5' 2.25" (1.581 m)   Wt 67.1 kg   LMP 10/11/2011   SpO2 98% Comment: RA  BMI 26.85 kg/m  Well-appearing no acute distress Alert and oriented x3 Clear to auscultation bilaterally Regular rate and rhythm Pulses intact; no peripheral edema  Imaging: I personally reviewed her available CT scan from today which demonstrates a stable ascending aortic aneurysm measuring less than 45 mm in maximal diameter  Impression/plan: Stable ascending aortic aneurysm posing very small to negligible risk given her good blood pressure control. Continue present management Follow-up in 1 year with repeat imaging as per guidelines  Ann Chambers Z. Orvan Seen, [place]

## 2020-12-30 ENCOUNTER — Ambulatory Visit (INDEPENDENT_AMBULATORY_CARE_PROVIDER_SITE_OTHER): Payer: BC Managed Care – PPO | Admitting: Internal Medicine

## 2020-12-30 ENCOUNTER — Encounter: Payer: Self-pay | Admitting: Internal Medicine

## 2020-12-30 ENCOUNTER — Other Ambulatory Visit: Payer: Self-pay

## 2020-12-30 DIAGNOSIS — J3089 Other allergic rhinitis: Secondary | ICD-10-CM

## 2020-12-30 DIAGNOSIS — J301 Allergic rhinitis due to pollen: Secondary | ICD-10-CM

## 2020-12-30 DIAGNOSIS — Z298 Encounter for other specified prophylactic measures: Secondary | ICD-10-CM | POA: Diagnosis not present

## 2020-12-30 NOTE — Progress Notes (Signed)
Allergy injections given by CMA. RTC in 2 weeks.

## 2020-12-30 NOTE — Patient Instructions (Signed)
0.5cc TGW in left arm sq 0.5cc D-MDM in right arm sq No reaction, pt tolerated well   RTC 2 weeks.

## 2021-01-20 ENCOUNTER — Other Ambulatory Visit: Payer: Self-pay

## 2021-01-20 ENCOUNTER — Ambulatory Visit (INDEPENDENT_AMBULATORY_CARE_PROVIDER_SITE_OTHER): Payer: BC Managed Care – PPO | Admitting: Internal Medicine

## 2021-01-20 ENCOUNTER — Encounter: Payer: Self-pay | Admitting: Internal Medicine

## 2021-01-20 VITALS — BP 110/80 | HR 68 | Temp 98.0°F | Ht 62.25 in | Wt 152.1 lb

## 2021-01-20 DIAGNOSIS — Z298 Encounter for other specified prophylactic measures: Secondary | ICD-10-CM

## 2021-01-20 DIAGNOSIS — J301 Allergic rhinitis due to pollen: Secondary | ICD-10-CM

## 2021-01-20 DIAGNOSIS — J3089 Other allergic rhinitis: Secondary | ICD-10-CM

## 2021-01-20 NOTE — Patient Instructions (Addendum)
0.5cc TGW in left arm sq 0.5cc D-MDM in right arm sq No reaction, pt tolerated well  RTC 2 weeks

## 2021-01-20 NOTE — Progress Notes (Signed)
Allergy injections given by CMA

## 2021-02-04 ENCOUNTER — Ambulatory Visit (INDEPENDENT_AMBULATORY_CARE_PROVIDER_SITE_OTHER): Payer: BC Managed Care – PPO | Admitting: Internal Medicine

## 2021-02-04 ENCOUNTER — Other Ambulatory Visit: Payer: Self-pay

## 2021-02-04 DIAGNOSIS — Z298 Encounter for other specified prophylactic measures: Secondary | ICD-10-CM | POA: Diagnosis not present

## 2021-02-04 DIAGNOSIS — J3089 Other allergic rhinitis: Secondary | ICD-10-CM

## 2021-02-04 NOTE — Progress Notes (Signed)
Patient seen today for allergy shots.  .5cc each of tree grass and mold given to patient in separate arms.  She tolerated well no reaction.  Follow up scheduled.

## 2021-02-10 NOTE — Patient Instructions (Signed)
Allergy injections in each arm as described. RTC 2 weeks.

## 2021-02-15 DIAGNOSIS — M4316 Spondylolisthesis, lumbar region: Secondary | ICD-10-CM | POA: Diagnosis not present

## 2021-02-15 DIAGNOSIS — M4180 Other forms of scoliosis, site unspecified: Secondary | ICD-10-CM | POA: Diagnosis not present

## 2021-02-17 ENCOUNTER — Ambulatory Visit (INDEPENDENT_AMBULATORY_CARE_PROVIDER_SITE_OTHER): Payer: BC Managed Care – PPO | Admitting: Internal Medicine

## 2021-02-17 ENCOUNTER — Other Ambulatory Visit: Payer: Self-pay

## 2021-02-17 DIAGNOSIS — Z298 Encounter for other specified prophylactic measures: Secondary | ICD-10-CM

## 2021-02-17 DIAGNOSIS — J3089 Other allergic rhinitis: Secondary | ICD-10-CM

## 2021-02-17 NOTE — Progress Notes (Signed)
Allergy injections given today:  0.5cc D-DM  (Right) UE  0.5cc Mold SQ  (Left) UE    RTC 2 weeks.   MJB, MD

## 2021-02-17 NOTE — Patient Instructions (Signed)
Seen for usual allergy injections each arm as described. Patient tolerated well. RTC in 2 weeks.

## 2021-03-03 ENCOUNTER — Ambulatory Visit (INDEPENDENT_AMBULATORY_CARE_PROVIDER_SITE_OTHER): Payer: BC Managed Care – PPO | Admitting: Internal Medicine

## 2021-03-03 ENCOUNTER — Other Ambulatory Visit: Payer: Self-pay

## 2021-03-03 DIAGNOSIS — Z298 Encounter for other specified prophylactic measures: Secondary | ICD-10-CM | POA: Diagnosis not present

## 2021-03-03 DIAGNOSIS — J301 Allergic rhinitis due to pollen: Secondary | ICD-10-CM

## 2021-03-03 DIAGNOSIS — Z9109 Other allergy status, other than to drugs and biological substances: Secondary | ICD-10-CM

## 2021-03-03 NOTE — Addendum Note (Signed)
Addended by: Angus Seller on: 03/03/2021 02:45 PM   Modules accepted: Level of Service

## 2021-03-03 NOTE — Progress Notes (Signed)
Allergy injections per CMA for TGW 0.5cc   And D-MDM 0.5cc

## 2021-03-03 NOTE — Progress Notes (Signed)
Patient received her allergy shots today. M. DM was given [place] in right arm and T. [employer]. W  in left arm. No reactions noted. Solutions expire 11/22 and the patient has been made aware.

## 2021-03-03 NOTE — Patient Instructions (Signed)
Patient received allergy injections x 2 for environmental and seasonal allergies. Tolerated well. No reaction

## 2021-03-15 ENCOUNTER — Ambulatory Visit: Payer: BC Managed Care – PPO | Admitting: Internal Medicine

## 2021-03-19 ENCOUNTER — Ambulatory Visit: Payer: BC Managed Care – PPO | Admitting: Internal Medicine

## 2021-03-21 ENCOUNTER — Other Ambulatory Visit: Payer: Self-pay | Admitting: Internal Medicine

## 2021-03-24 ENCOUNTER — Ambulatory Visit (INDEPENDENT_AMBULATORY_CARE_PROVIDER_SITE_OTHER): Payer: BC Managed Care – PPO | Admitting: Internal Medicine

## 2021-03-24 ENCOUNTER — Other Ambulatory Visit: Payer: Self-pay

## 2021-03-24 DIAGNOSIS — Z9109 Other allergy status, other than to drugs and biological substances: Secondary | ICD-10-CM

## 2021-03-24 DIAGNOSIS — J3089 Other allergic rhinitis: Secondary | ICD-10-CM | POA: Diagnosis not present

## 2021-03-24 DIAGNOSIS — J301 Allergic rhinitis due to pollen: Secondary | ICD-10-CM

## 2021-03-24 DIAGNOSIS — Z298 Encounter for other specified prophylactic measures: Secondary | ICD-10-CM | POA: Diagnosis not present

## 2021-03-24 NOTE — Progress Notes (Signed)
Patient received her allergy shots today. M. DM was given [place] in right arm and T. [employer]. W [place] in left arm. No reactions noted. Solutions expire 11/22 and the patient has been made aware. Tolerated well. No reaction.

## 2021-03-26 DIAGNOSIS — M25551 Pain in right hip: Secondary | ICD-10-CM | POA: Diagnosis not present

## 2021-03-26 NOTE — Progress Notes (Signed)
IElby Showers, MD, have reviewed all documentation for this visit. The documentation on 03/26/21 for the exam, diagnosis, procedures, and orders are all accurate and complete.

## 2021-03-29 ENCOUNTER — Ambulatory Visit: Payer: BC Managed Care – PPO | Admitting: Internal Medicine

## 2021-04-13 ENCOUNTER — Telehealth: Payer: Self-pay | Admitting: Internal Medicine

## 2021-04-13 DIAGNOSIS — J301 Allergic rhinitis due to pollen: Secondary | ICD-10-CM | POA: Diagnosis not present

## 2021-04-13 DIAGNOSIS — H1045 Other chronic allergic conjunctivitis: Secondary | ICD-10-CM | POA: Diagnosis not present

## 2021-04-13 DIAGNOSIS — J3089 Other allergic rhinitis: Secondary | ICD-10-CM | POA: Diagnosis not present

## 2021-04-13 NOTE — Telephone Encounter (Signed)
Ann Chambers from [facility] called to say they had seen Ann Chambers today and she told them we needed more serum for her shots. She said we needed to fax the shot record and a request for more serum.  Fax [phone]

## 2021-04-13 NOTE — Telephone Encounter (Signed)
Filled out and faxed. Patient signature required. The office will have to obtain it somehow.

## 2021-04-14 DIAGNOSIS — M4316 Spondylolisthesis, lumbar region: Secondary | ICD-10-CM | POA: Diagnosis not present

## 2021-04-14 DIAGNOSIS — M4156 Other secondary scoliosis, lumbar region: Secondary | ICD-10-CM | POA: Diagnosis not present

## 2021-04-23 DIAGNOSIS — J301 Allergic rhinitis due to pollen: Secondary | ICD-10-CM | POA: Diagnosis not present

## 2021-04-26 DIAGNOSIS — J3089 Other allergic rhinitis: Secondary | ICD-10-CM | POA: Diagnosis not present

## 2021-05-07 ENCOUNTER — Ambulatory Visit (INDEPENDENT_AMBULATORY_CARE_PROVIDER_SITE_OTHER): Payer: BC Managed Care – PPO

## 2021-05-07 ENCOUNTER — Other Ambulatory Visit: Payer: Self-pay

## 2021-05-07 DIAGNOSIS — Z298 Encounter for other specified prophylactic measures: Secondary | ICD-10-CM

## 2021-05-07 DIAGNOSIS — Z7689 Persons encountering health services in other specified circumstances: Secondary | ICD-10-CM | POA: Diagnosis not present

## 2021-05-07 NOTE — Progress Notes (Addendum)
Patient presented for allergy injections. She was given 0.1 T-G-W in right arm and  0.1 M-DM in her left arm. No adverse reaction noted after 10 minutes. Patient left before being released.   Patient came in for first shot of her new serum. Patient stated that she was told by Tiajuana Amass, MD that she did not need to start over at 0.1 and work to maintenance. I told the patient that I would not break protocol unless I heard it from the provider and her staff. I called the provider and spoke with [facility] her medical staff. She advised that she was to in fact start over at 0.1 and work her way up. She also advised that she would need to get her next shot within 7 days to be eligible for increase. Patient states that she can not come in next week. She has been advised I would be giving her 0.1 again unless I had notification from Tiajuana Amass, MD to do otherwise.    Attending Note: I have reviewed this note and agree with this disposition.  Will fax this note to Dr. Orvil Feil for review and confirmation on how to proceed going forward. Uvaldo Bristle, MD

## 2021-05-19 ENCOUNTER — Other Ambulatory Visit: Payer: Self-pay

## 2021-05-19 ENCOUNTER — Ambulatory Visit (INDEPENDENT_AMBULATORY_CARE_PROVIDER_SITE_OTHER): Payer: BC Managed Care – PPO

## 2021-05-19 VITALS — BP 122/78

## 2021-05-19 DIAGNOSIS — J3089 Other allergic rhinitis: Secondary | ICD-10-CM | POA: Diagnosis not present

## 2021-05-19 NOTE — Progress Notes (Signed)
Patient presented for allergy injections. She was given 0.1 T-G-W in right arm and  0.1 M-DM in her left arm. No adverse reaction noted after 15 minutes. She was not moved up to higher dose since it was more than a week since last shot.

## 2021-05-26 ENCOUNTER — Other Ambulatory Visit: Payer: Self-pay

## 2021-05-26 ENCOUNTER — Ambulatory Visit (INDEPENDENT_AMBULATORY_CARE_PROVIDER_SITE_OTHER): Payer: BC Managed Care – PPO

## 2021-05-26 VITALS — BP 108/78 | HR 86

## 2021-05-26 DIAGNOSIS — J3089 Other allergic rhinitis: Secondary | ICD-10-CM

## 2021-05-26 NOTE — Progress Notes (Signed)
Patient presented for allergy injections. She was given 0.2 T-G-W in right arm and  0.2 M-DM in her left arm. No adverse reaction noted after 15 minutes.

## 2021-05-27 DIAGNOSIS — M25551 Pain in right hip: Secondary | ICD-10-CM | POA: Diagnosis not present

## 2021-06-02 ENCOUNTER — Ambulatory Visit (INDEPENDENT_AMBULATORY_CARE_PROVIDER_SITE_OTHER): Payer: BC Managed Care – PPO

## 2021-06-02 ENCOUNTER — Other Ambulatory Visit: Payer: Self-pay

## 2021-06-02 DIAGNOSIS — J3089 Other allergic rhinitis: Secondary | ICD-10-CM | POA: Diagnosis not present

## 2021-06-02 NOTE — Progress Notes (Addendum)
Patient presented for allergy injections. She was given 0.2 T-G-W in right arm and  0.2 M-DM in her left arm. No adverse reaction noted after 15 minutes.    IElby Showers, MD, have reviewed all documentation for this visit. The documentation on 06/02/21 for the exam, diagnosis, procedures, and orders are all accurate and complete.

## 2021-06-10 ENCOUNTER — Ambulatory Visit (INDEPENDENT_AMBULATORY_CARE_PROVIDER_SITE_OTHER): Payer: BC Managed Care – PPO | Admitting: Internal Medicine

## 2021-06-10 ENCOUNTER — Other Ambulatory Visit: Payer: Self-pay

## 2021-06-10 VITALS — BP 138/82 | HR 83

## 2021-06-10 DIAGNOSIS — J3089 Other allergic rhinitis: Secondary | ICD-10-CM

## 2021-06-10 DIAGNOSIS — J301 Allergic rhinitis due to pollen: Secondary | ICD-10-CM | POA: Diagnosis not present

## 2021-06-10 DIAGNOSIS — Z9109 Other allergy status, other than to drugs and biological substances: Secondary | ICD-10-CM | POA: Diagnosis not present

## 2021-06-10 DIAGNOSIS — Z298 Encounter for other specified prophylactic measures: Secondary | ICD-10-CM

## 2021-06-10 NOTE — Progress Notes (Signed)
Patient presented for allergy injections. She was given 0.4 T-G-W in right arm and  0.4 M-DM in her left arm. No adverse reaction noted after 15 minutes.   Patient here for nurse visit for immunotherapy.   Notes regarding visit reviewed.IElby Showers, MD, have reviewed all documentation for this visit. The documentation on 06/10/21 for the exam, diagnosis, procedures, and orders are all accurate and complete.

## 2021-06-10 NOTE — Patient Instructions (Signed)
Immunotherapy today. RTC in 2 weeks.

## 2021-06-15 ENCOUNTER — Ambulatory Visit (INDEPENDENT_AMBULATORY_CARE_PROVIDER_SITE_OTHER): Payer: BC Managed Care – PPO

## 2021-06-15 ENCOUNTER — Other Ambulatory Visit: Payer: Self-pay

## 2021-06-15 DIAGNOSIS — J3089 Other allergic rhinitis: Secondary | ICD-10-CM | POA: Diagnosis not present

## 2021-06-15 NOTE — Addendum Note (Signed)
Addended by: Angus Seller on: 06/15/2021 10:01 AM   Modules accepted: Level of Service

## 2021-06-15 NOTE — Progress Notes (Signed)
Patient presented for allergy injections. She was given 0.5 T-G-W in right arm and  0.5 M-DM in her left arm. No adverse reaction noted after 15 minutes.

## 2021-06-18 DIAGNOSIS — M1611 Unilateral primary osteoarthritis, right hip: Secondary | ICD-10-CM | POA: Diagnosis not present

## 2021-06-18 DIAGNOSIS — M7061 Trochanteric bursitis, right hip: Secondary | ICD-10-CM | POA: Diagnosis not present

## 2021-06-30 ENCOUNTER — Ambulatory Visit (INDEPENDENT_AMBULATORY_CARE_PROVIDER_SITE_OTHER): Payer: BC Managed Care – PPO

## 2021-06-30 ENCOUNTER — Other Ambulatory Visit: Payer: Self-pay

## 2021-06-30 VITALS — BP 120/86 | HR 69

## 2021-06-30 DIAGNOSIS — I10 Essential (primary) hypertension: Secondary | ICD-10-CM | POA: Diagnosis not present

## 2021-06-30 DIAGNOSIS — J3089 Other allergic rhinitis: Secondary | ICD-10-CM

## 2021-06-30 NOTE — Progress Notes (Signed)
°  Patient presented for allergy injections. She was given 0.5 T-G-W in right arm and  0.5 M-DM in her left arm. No adverse reaction noted after 15 minutes.

## 2021-07-01 NOTE — Addendum Note (Signed)
Addended by: Angus Seller on: 07/01/2021 01:04 PM   Modules accepted: Level of Service

## 2021-07-02 DIAGNOSIS — M25551 Pain in right hip: Secondary | ICD-10-CM | POA: Diagnosis not present

## 2021-07-11 ENCOUNTER — Other Ambulatory Visit: Payer: Self-pay | Admitting: Internal Medicine

## 2021-07-19 DIAGNOSIS — M25551 Pain in right hip: Secondary | ICD-10-CM | POA: Diagnosis not present

## 2021-07-23 ENCOUNTER — Other Ambulatory Visit: Payer: Self-pay

## 2021-07-23 ENCOUNTER — Ambulatory Visit (INDEPENDENT_AMBULATORY_CARE_PROVIDER_SITE_OTHER): Payer: BC Managed Care – PPO

## 2021-07-23 DIAGNOSIS — J3089 Other allergic rhinitis: Secondary | ICD-10-CM

## 2021-07-23 NOTE — Progress Notes (Signed)
Patient presented for allergy injections. She was given 0.5 T-G-W in right arm and  0.5 M-DM in her left arm. No adverse reaction noted after 15 minutes.

## 2021-07-30 DIAGNOSIS — M1611 Unilateral primary osteoarthritis, right hip: Secondary | ICD-10-CM | POA: Diagnosis not present

## 2021-07-30 DIAGNOSIS — M7061 Trochanteric bursitis, right hip: Secondary | ICD-10-CM | POA: Diagnosis not present

## 2021-08-09 ENCOUNTER — Encounter: Payer: Self-pay | Admitting: Internal Medicine

## 2021-08-09 ENCOUNTER — Other Ambulatory Visit: Payer: Self-pay

## 2021-08-09 ENCOUNTER — Ambulatory Visit (INDEPENDENT_AMBULATORY_CARE_PROVIDER_SITE_OTHER): Payer: BC Managed Care – PPO | Admitting: Internal Medicine

## 2021-08-09 VITALS — BP 128/80

## 2021-08-09 DIAGNOSIS — Z298 Encounter for other specified prophylactic measures: Secondary | ICD-10-CM | POA: Diagnosis not present

## 2021-08-09 DIAGNOSIS — J3089 Other allergic rhinitis: Secondary | ICD-10-CM | POA: Diagnosis not present

## 2021-08-09 NOTE — Progress Notes (Signed)
Patient presented for allergy injections. She was given 0.5 T-G-W in right arm and  0.5 M-DM in her left arm. No adverse reaction noted after 15 minutes. ? ?Allergy injections given by CMA ?

## 2021-08-20 ENCOUNTER — Ambulatory Visit (INDEPENDENT_AMBULATORY_CARE_PROVIDER_SITE_OTHER): Payer: BC Managed Care – PPO | Admitting: Internal Medicine

## 2021-08-20 ENCOUNTER — Encounter: Payer: Self-pay | Admitting: Internal Medicine

## 2021-08-20 ENCOUNTER — Other Ambulatory Visit: Payer: Self-pay

## 2021-08-20 VITALS — BP 122/88 | HR 76

## 2021-08-20 DIAGNOSIS — Z9109 Other allergy status, other than to drugs and biological substances: Secondary | ICD-10-CM

## 2021-08-20 DIAGNOSIS — J301 Allergic rhinitis due to pollen: Secondary | ICD-10-CM | POA: Diagnosis not present

## 2021-08-20 DIAGNOSIS — J3089 Other allergic rhinitis: Secondary | ICD-10-CM | POA: Diagnosis not present

## 2021-08-20 NOTE — Progress Notes (Signed)
Ann Chambers is a 61 y.o. Female who presents today for immunotherapy.  ?     ? ?Patient presented for allergy injections. She was given 0.5 T-G-W in right arm and  0.5 M-DM in her left arm. No adverse reaction noted after 15 minutes.  ?  ? ? ?Supervised these injections. ? ?I, Elby Showers, MD, have reviewed all documentation for this visit. The documentation on 08/20/21 for the exam, diagnosis, procedures, and orders are all accurate and complete. ?

## 2021-08-20 NOTE — Patient Instructions (Signed)
Received allergy injections x 2 for seasonal and allergic rhinitis. RTC 2 weeks. ?

## 2021-09-14 ENCOUNTER — Ambulatory Visit (INDEPENDENT_AMBULATORY_CARE_PROVIDER_SITE_OTHER): Payer: BC Managed Care – PPO | Admitting: Internal Medicine

## 2021-09-14 ENCOUNTER — Encounter: Payer: Self-pay | Admitting: Internal Medicine

## 2021-09-14 VITALS — BP 122/80 | HR 71

## 2021-09-14 DIAGNOSIS — J3089 Other allergic rhinitis: Secondary | ICD-10-CM | POA: Diagnosis not present

## 2021-09-14 NOTE — Progress Notes (Signed)
? ?  Patient presented for allergy injections. She was given 0.5 T-G-W in right arm and  0.5 M-DM in her left arm. No adverse reaction noted after 15 minutes. ? ?Presented for immunotherapy above. ? ?I, Elby Showers, MD, have reviewed all documentation for this visit. The documentation on 09/14/21 for the exam, diagnosis, procedures, and orders are all accurate and complete. ?

## 2021-09-17 DIAGNOSIS — Z1231 Encounter for screening mammogram for malignant neoplasm of breast: Secondary | ICD-10-CM | POA: Diagnosis not present

## 2021-09-30 ENCOUNTER — Other Ambulatory Visit: Payer: BC Managed Care – PPO

## 2021-09-30 DIAGNOSIS — I10 Essential (primary) hypertension: Secondary | ICD-10-CM | POA: Diagnosis not present

## 2021-09-30 DIAGNOSIS — Z136 Encounter for screening for cardiovascular disorders: Secondary | ICD-10-CM

## 2021-09-30 DIAGNOSIS — R5383 Other fatigue: Secondary | ICD-10-CM | POA: Diagnosis not present

## 2021-10-01 LAB — TSH: TSH: 1.79 mIU/L (ref 0.40–4.50)

## 2021-10-01 LAB — CBC WITH DIFFERENTIAL/PLATELET
Absolute Monocytes: 392 cells/uL (ref 200–950)
Basophils Absolute: 32 cells/uL (ref 0–200)
Basophils Relative: 0.7 %
Eosinophils Absolute: 171 cells/uL (ref 15–500)
Eosinophils Relative: 3.8 %
HCT: 41.9 % (ref 35.0–45.0)
Hemoglobin: 13.6 g/dL (ref 11.7–15.5)
Lymphs Abs: 941 cells/uL (ref 850–3900)
MCH: 30.3 pg (ref 27.0–33.0)
MCHC: 32.5 g/dL (ref 32.0–36.0)
MCV: 93.3 fL (ref 80.0–100.0)
MPV: 10.1 fL (ref 7.5–12.5)
Monocytes Relative: 8.7 %
Neutro Abs: 2966 cells/uL (ref 1500–7800)
Neutrophils Relative %: 65.9 %
Platelets: 255 10*3/uL (ref 140–400)
RBC: 4.49 10*6/uL (ref 3.80–5.10)
RDW: 12.3 % (ref 11.0–15.0)
Total Lymphocyte: 20.9 %
WBC: 4.5 10*3/uL (ref 3.8–10.8)

## 2021-10-01 LAB — COMPLETE METABOLIC PANEL WITH GFR
AG Ratio: 1.7 (calc) (ref 1.0–2.5)
ALT: 19 U/L (ref 6–29)
AST: 24 U/L (ref 10–35)
Albumin: 4.3 g/dL (ref 3.6–5.1)
Alkaline phosphatase (APISO): 89 U/L (ref 37–153)
BUN: 14 mg/dL (ref 7–25)
CO2: 27 mmol/L (ref 20–32)
Calcium: 9.5 mg/dL (ref 8.6–10.4)
Chloride: 106 mmol/L (ref 98–110)
Creat: 0.64 mg/dL (ref 0.50–1.05)
Globulin: 2.5 g/dL (calc) (ref 1.9–3.7)
Glucose, Bld: 91 mg/dL (ref 65–99)
Potassium: 4.8 mmol/L (ref 3.5–5.3)
Sodium: 141 mmol/L (ref 135–146)
Total Bilirubin: 0.4 mg/dL (ref 0.2–1.2)
Total Protein: 6.8 g/dL (ref 6.1–8.1)
eGFR: 100 mL/min/{1.73_m2} (ref >=60)

## 2021-10-01 LAB — LIPID PANEL
Cholesterol: 189 mg/dL (ref <200)
HDL: 95 mg/dL (ref >=50)
LDL Cholesterol (Calc): 80 mg/dL (calc)
Non-HDL Cholesterol (Calc): 94 mg/dL (calc) (ref <130)
Total CHOL/HDL Ratio: 2 (calc) (ref <5.0)
Triglycerides: 60 mg/dL (ref <150)

## 2021-10-04 ENCOUNTER — Ambulatory Visit (INDEPENDENT_AMBULATORY_CARE_PROVIDER_SITE_OTHER): Payer: BC Managed Care – PPO | Admitting: Internal Medicine

## 2021-10-04 VITALS — BP 120/72 | HR 76 | Temp 96.6°F | Resp 16 | Ht 66.0 in | Wt 157.4 lb

## 2021-10-04 DIAGNOSIS — I10 Essential (primary) hypertension: Secondary | ICD-10-CM | POA: Diagnosis not present

## 2021-10-04 DIAGNOSIS — J3089 Other allergic rhinitis: Secondary | ICD-10-CM | POA: Diagnosis not present

## 2021-10-04 DIAGNOSIS — Z8249 Family history of ischemic heart disease and other diseases of the circulatory system: Secondary | ICD-10-CM | POA: Diagnosis not present

## 2021-10-04 DIAGNOSIS — Z803 Family history of malignant neoplasm of breast: Secondary | ICD-10-CM

## 2021-10-04 DIAGNOSIS — Z0000 Encounter for general adult medical examination without abnormal findings: Secondary | ICD-10-CM

## 2021-10-04 DIAGNOSIS — I77810 Thoracic aortic ectasia: Secondary | ICD-10-CM

## 2021-10-04 DIAGNOSIS — Z87898 Personal history of other specified conditions: Secondary | ICD-10-CM

## 2021-10-04 DIAGNOSIS — Z8719 Personal history of other diseases of the digestive system: Secondary | ICD-10-CM | POA: Diagnosis not present

## 2021-10-04 DIAGNOSIS — Z801 Family history of malignant neoplasm of trachea, bronchus and lung: Secondary | ICD-10-CM | POA: Diagnosis not present

## 2021-10-04 LAB — POC URINALSYSI DIPSTICK (AUTOMATED)
Bilirubin, UA: NEGATIVE
Blood, UA: NEGATIVE
Glucose, UA: NEGATIVE
Ketones, UA: NEGATIVE
Leukocytes, UA: NEGATIVE
Nitrite, UA: NEGATIVE
Protein, UA: NEGATIVE
Spec Grav, UA: 1.015 (ref 1.010–1.025)
Urobilinogen, UA: 0.2 E.U./dL
pH, UA: 6 (ref 5.0–8.0)

## 2021-10-04 NOTE — Progress Notes (Unsigned)
? ?  Subjective:  ? ? Patient ID: Ann Chambers, female    DOB: [birth year], 61 y.o.   MRN: [id number] ? ?HPI 61 year old Female  ? ? ? ?Review of Systems ? ?   ?Objective:  ? Physical Exam ? ? ? ? ?   ?Assessment & Plan:  ? ? ?

## 2021-10-18 ENCOUNTER — Ambulatory Visit (INDEPENDENT_AMBULATORY_CARE_PROVIDER_SITE_OTHER): Payer: BC Managed Care – PPO | Admitting: Internal Medicine

## 2021-10-18 DIAGNOSIS — J3089 Other allergic rhinitis: Secondary | ICD-10-CM

## 2021-10-18 NOTE — Progress Notes (Signed)
Patient presented for allergy injections. She was given 0.5 T-G-W in right arm and  0.5 M-DM in her left arm. No adverse reaction noted after 15 minutes.  IElby Showers, MD, have reviewed all documentation for this visit. The documentation on 10/18/21 for the exam, diagnosis, procedures, and orders are all accurate and complete.

## 2021-10-28 ENCOUNTER — Encounter: Payer: Self-pay | Admitting: Internal Medicine

## 2021-10-28 ENCOUNTER — Ambulatory Visit (HOSPITAL_COMMUNITY)
Admission: RE | Admit: 2021-10-28 | Discharge: 2021-10-28 | Disposition: A | Discharge status: Home / Self Care | Payer: Self-pay | Source: Ambulatory Visit | Attending: Internal Medicine | Admitting: Internal Medicine

## 2021-10-28 DIAGNOSIS — Z8249 Family history of ischemic heart disease and other diseases of the circulatory system: Secondary | ICD-10-CM | POA: Insufficient documentation

## 2021-11-03 ENCOUNTER — Ambulatory Visit (INDEPENDENT_AMBULATORY_CARE_PROVIDER_SITE_OTHER): Payer: BC Managed Care – PPO | Admitting: Internal Medicine

## 2021-11-03 ENCOUNTER — Other Ambulatory Visit: Payer: Self-pay | Admitting: Cardiothoracic Surgery

## 2021-11-03 VITALS — BP 138/90 | HR 80

## 2021-11-03 DIAGNOSIS — J301 Allergic rhinitis due to pollen: Secondary | ICD-10-CM

## 2021-11-03 DIAGNOSIS — I712 Thoracic aortic aneurysm, without rupture, unspecified: Secondary | ICD-10-CM

## 2021-11-03 DIAGNOSIS — Z7689 Persons encountering health services in other specified circumstances: Secondary | ICD-10-CM | POA: Diagnosis not present

## 2021-11-03 DIAGNOSIS — Z298 Encounter for other specified prophylactic measures: Secondary | ICD-10-CM

## 2021-11-03 DIAGNOSIS — I10 Essential (primary) hypertension: Secondary | ICD-10-CM | POA: Diagnosis not present

## 2021-11-03 DIAGNOSIS — J3089 Other allergic rhinitis: Secondary | ICD-10-CM

## 2021-11-03 NOTE — Progress Notes (Signed)
Patient presented for allergy injections. She was given 0.5 T-G-W in right arm and  0.5 M-DM in her left arm. No adverse reaction noted after 15 minutes.  Patients Blood pressure was elevated today.She states that she took her medicine at 6 am. She said she was rushing around this am. She has been advised to take it at home to make sure it returns to normal or let us know if it doesn't.    I agree with plan regarding BP measurements. Call if persistently elevated.  MJB, MD  I, Elby Showers, MD, have reviewed all documentation for this visit. The documentation on 11/03/21 for the exam, diagnosis, procedures, and orders are all accurate and complete.

## 2021-11-15 ENCOUNTER — Ambulatory Visit (INDEPENDENT_AMBULATORY_CARE_PROVIDER_SITE_OTHER): Payer: BC Managed Care – PPO | Admitting: Internal Medicine

## 2021-11-15 VITALS — BP 110/70 | HR 85

## 2021-11-15 DIAGNOSIS — Z298 Encounter for other specified prophylactic measures: Secondary | ICD-10-CM | POA: Diagnosis not present

## 2021-11-15 DIAGNOSIS — J3089 Other allergic rhinitis: Secondary | ICD-10-CM

## 2021-11-15 NOTE — Progress Notes (Signed)
Patient presented for allergy injections. She was given 0.5 T-G-W in right arm and  0.5 M-DM in her left arm. No adverse reaction noted after 15 minutes.       IElby Showers, MD, have reviewed all documentation for this visit. The documentation on 11/15/21 for the exam, diagnosis, procedures, and orders are all accurate and complete.

## 2021-11-21 ENCOUNTER — Encounter: Payer: Self-pay | Admitting: Internal Medicine

## 2021-12-02 ENCOUNTER — Encounter: Payer: Self-pay | Admitting: Internal Medicine

## 2021-12-02 ENCOUNTER — Ambulatory Visit (INDEPENDENT_AMBULATORY_CARE_PROVIDER_SITE_OTHER): Payer: BC Managed Care – PPO | Admitting: Internal Medicine

## 2021-12-02 VITALS — BP 130/88

## 2021-12-02 DIAGNOSIS — J3089 Other allergic rhinitis: Secondary | ICD-10-CM

## 2021-12-02 DIAGNOSIS — Z298 Encounter for other specified prophylactic measures: Secondary | ICD-10-CM | POA: Diagnosis not present

## 2021-12-02 DIAGNOSIS — Z2989 Encounter for other specified prophylactic measures: Secondary | ICD-10-CM

## 2021-12-02 NOTE — Progress Notes (Signed)
Patient presented for allergy injections. She was given 0.5 T-G-W in right arm and  0.5 M-DM in her left arm. No adverse reaction noted after 15 minutes.   IElby Showers, MD, have reviewed all documentation for this visit. The documentation on 12/02/21 for the exam, diagnosis, procedures, and orders are all accurate and complete.

## 2021-12-13 ENCOUNTER — Ambulatory Visit (INDEPENDENT_AMBULATORY_CARE_PROVIDER_SITE_OTHER): Payer: BC Managed Care – PPO | Admitting: Physician Assistant

## 2021-12-13 ENCOUNTER — Other Ambulatory Visit: Payer: Self-pay | Admitting: Physician Assistant

## 2021-12-13 ENCOUNTER — Other Ambulatory Visit: Payer: BC Managed Care – PPO

## 2021-12-13 VITALS — BP 135/88 | HR 71 | Resp 18 | Ht 66.0 in | Wt 156.0 lb

## 2021-12-13 DIAGNOSIS — I712 Thoracic aortic aneurysm, without rupture, unspecified: Secondary | ICD-10-CM

## 2021-12-13 NOTE — Patient Instructions (Addendum)
Risk Modification in those with ascending thoracic aortic aneurysm:  Continue good control of blood pressure (prefer SBP 130/80 or less). She is on Valsartan 80 mg daily  2. Avoid fluoroquinolone antibiotics (I.e Ciprofloxacin, Avelox, Levofloxacin, Ofloxacin)  3.  Use of statin (to decrease cardiovascular risk). Her last lipid profile done in May 2023 Showed cholesterol to be 181, triglycerides 43, and LDL 76. Will defer to primary for further surveillance and when/if to start statin  4.  Exercise and activity limitations is individualized, but in general, contact sports are to be  avoided and one should avoid heavy lifting (defined as half of ideal body weight) and exercises involving sustained Valsalva maneuver.  5. Counseling for those suspected of having genetically mediated disease. First-degree relatives of those with TAA disease should be screened as well as those who have a connective tissue disease (I.e with Marfan syndrome, Ehlers-Danlos syndrome,  and Loeys-Dietz syndrome) or a  bicuspid aortic valve,have an increased risk for  complications related to TAA. Patient with no history of connective tissue disease. Echo done in 2021 did not comment on a bicuspid or tricuspid aortic valve;although there was no significant valvular disease noted;only dilatation or aorta 40 mm.  6. Patient has no history of tobacco abuse

## 2021-12-13 NOTE — Progress Notes (Signed)
[address]       [place] [address]             [phone]        PCP is Baxley, Cresenciano Lick, MD Referring Provider is Elby Showers, MD  Chief Complaint: Ascending thoracic aortic aneurysm   HPI: This is a 61 year old female with a past medical history of essential hypertension who was incidentally found to have an ascending thoracic aneurysm. CT chest done in 2022 showed stable ectasia of the  ascending thoracic aorta measuring up to 3.9 cm. Also, stable small pulmonary nodules (these are compatible with benign findings based on their size and stability). Of note, she has a family history of aneurysm rupture and uncle and grandmother.Patient denies chest pressure, chest tightness, chest pain or shortness of breath. She has been a little stressed of late as sister was just diagnosed with lung cancer.  Past Medical History:  Diagnosis Date   Allergic rhinitis    Egg allergy    Essential hypertension    Family history of lung cancer    Family history of prostate cancer    Migraines    Resolved after menopause   Scoliosis     Past Surgical History:  Procedure Laterality Date   APPENDECTOMY     right shoulder surgery 2002 and 2005      Family History  Problem Relation Age of Onset   CAD Father        h/o bypass in his 29s, possible heart issues in his 54s   Hypertension Father    Prostate cancer Father        was not metastatic, but his doctors were concerned it could spread   Heart failure Mother        Rheumatic fever, nonischemic   Hypertension Mother    Rheumatic fever Mother    Heart attack Paternal Grandmother    Heart disease Paternal Grandfather    Hypertension Sister    Lung cancer Sister 33       non smoker- had genetic testing and was positve for a gene- thinks it was EGFR?   Heart attack Paternal Aunt    Kidney disease Paternal Uncle    Other Paternal Barbaraann Rondo        'ruptured aorta'    Social History Social History   Tobacco  Use   Smoking status: Never   Smokeless tobacco: Never  Substance Use Topics   Alcohol use: Yes    Alcohol/week: 3.0 standard drinks of alcohol    Types: 3 Glasses of wine per week    Comment: Glass of wine 3-5 nights a week   Drug use: No     Allergies  Allergen Reactions   Eggs Or Egg-Derived Products Nausea Only    Raw eggs only   Peanuts [Peanut Oil] Anaphylaxis    Review of Systems Non contributory except as stated in HPI  LMP 10/11/2011   Vital Signs: Vitals:   12/13/21 1326  BP: 135/88  Pulse: 71  Resp: 18  SpO2: 98%       Physical Exam: CV-RRR, no murmur, no carotid bruit Pulmonary-Clear to auscultation bilaterally Abdomen-Soft, non tender, bowel sounds present Extremities-No LE edema   Diagnostic Tests:  Narrative & Impression  EXAM: OVER-READ INTERPRETATION  CT CHEST   The following report is an over-read performed by radiologist Dr. Aletta Edouard of [facility], [place] on 10/28/2021. This over-read does not include interpretation of cardiac  or coronary anatomy or pathology. The coronary calcium score interpretation by the cardiologist is attached.   COMPARISON:  CT of the chest on 12/17/2020 and additional prior studies.   FINDINGS: Vascular: The ascending thoracic aorta is mildly dilated measuring up to 4 cm in estimated maximum diameter. This is similar to previous diameter by CT of the chest.   Mediastinum/Nodes: Visualized mediastinum and hilar regions demonstrate no lymphadenopathy or masses. Small hiatal hernia present.   Lungs/Pleura: Stable 3 mm fissural nodule in the right middle lobe likely representing an intrapulmonary lymph node. Imaging is not high enough to see the other tiny nodules seen by prior CT. Visualized lungs show no evidence of pulmonary edema, consolidation, pneumothorax or pleural fluid.   Upper Abdomen: No acute abnormality.   Musculoskeletal: No chest wall mass or suspicious bone  lesions identified.   IMPRESSION: Mild aneurysmal dilatation of the ascending thoracic aorta measuring up to 4 cm. This is similar to prior measurements of 3.9 cm dating back to a 2018 calcium score study.   Electronically Signed: By: Aletta Edouard M.D. On: 10/28/2021 11:47   Impression and Plan: We reviewed the finding of the CT done 10/28/2021 that her ATAA is measuring up to 4 cm. This is similar to prior measurements of 3.9 cm. Of note, her coronary calcium score is 0 and pulmonary nodules are stable. We discussed risk assessment (ie. Blood pressure control, use of statin, avoidance of fluoroquinolone antibiotics, no heavy lifting etc) and she does not need surgical intervention at this time. She will return to the office in one year with a CT of the chest for further surveillance of ascending thoracic aortic aneurysm.    Nani Skillern, PA-C [facility] [phone]

## 2021-12-14 ENCOUNTER — Ambulatory Visit (INDEPENDENT_AMBULATORY_CARE_PROVIDER_SITE_OTHER): Payer: BC Managed Care – PPO | Admitting: Internal Medicine

## 2021-12-14 VITALS — BP 138/80 | HR 68

## 2021-12-14 DIAGNOSIS — J3089 Other allergic rhinitis: Secondary | ICD-10-CM

## 2021-12-14 DIAGNOSIS — Z298 Encounter for other specified prophylactic measures: Secondary | ICD-10-CM | POA: Diagnosis not present

## 2021-12-14 NOTE — Progress Notes (Signed)
Patient presented for allergy injections. She was given 0.5 T-G-W in right arm and  0.5 M-DM in her left arm. No adverse reaction noted after 15 minutes.    IElby Showers, MD, have reviewed all documentation for this visit. The documentation on 12/14/21 for the exam, diagnosis, procedures, and orders are all accurate and complete.

## 2021-12-20 ENCOUNTER — Other Ambulatory Visit: Payer: Self-pay | Admitting: Internal Medicine

## 2021-12-27 ENCOUNTER — Ambulatory Visit (INDEPENDENT_AMBULATORY_CARE_PROVIDER_SITE_OTHER): Payer: BC Managed Care – PPO | Admitting: Internal Medicine

## 2021-12-27 VITALS — BP 138/88

## 2021-12-27 DIAGNOSIS — Z7689 Persons encountering health services in other specified circumstances: Secondary | ICD-10-CM

## 2021-12-27 DIAGNOSIS — J3089 Other allergic rhinitis: Secondary | ICD-10-CM

## 2021-12-27 DIAGNOSIS — Z298 Encounter for other specified prophylactic measures: Secondary | ICD-10-CM | POA: Diagnosis not present

## 2021-12-27 NOTE — Progress Notes (Signed)
Patient presented for allergy injections by CMA. She was given 0.5 T-G-W in right arm and  0.5 M-DM in her left arm. No adverse reaction noted after 15 minutes.   IElby Showers, MD, have reviewed all documentation for this visit. The documentation on 12/27/21 for the exam, diagnosis, procedures, and orders are all accurate and complete.

## 2022-01-13 ENCOUNTER — Ambulatory Visit: Payer: BC Managed Care – PPO

## 2022-01-13 ENCOUNTER — Encounter: Payer: Self-pay | Admitting: Internal Medicine

## 2022-01-13 ENCOUNTER — Ambulatory Visit (INDEPENDENT_AMBULATORY_CARE_PROVIDER_SITE_OTHER): Payer: BC Managed Care – PPO | Admitting: Internal Medicine

## 2022-01-13 VITALS — BP 124/76

## 2022-01-13 DIAGNOSIS — Z298 Encounter for other specified prophylactic measures: Secondary | ICD-10-CM

## 2022-01-13 DIAGNOSIS — J3089 Other allergic rhinitis: Secondary | ICD-10-CM | POA: Diagnosis not present

## 2022-01-13 NOTE — Progress Notes (Signed)
Patient presented for allergy injections by CMA. She was given 0.5 T-G-W in right arm and  0.5 M-DM in her left arm. No adverse reaction noted after 15 minutes.   Patient received these injections today. MJB, MD  I, Elby Showers, MD, have reviewed all documentation for this visit. The documentation on 01/13/22 for the exam, diagnosis, procedures, and orders are all accurate and complete.

## 2022-01-19 DIAGNOSIS — J301 Allergic rhinitis due to pollen: Secondary | ICD-10-CM | POA: Diagnosis not present

## 2022-01-20 DIAGNOSIS — J3089 Other allergic rhinitis: Secondary | ICD-10-CM | POA: Diagnosis not present

## 2022-01-23 ENCOUNTER — Telehealth: Payer: Self-pay | Admitting: Internal Medicine

## 2022-01-23 ENCOUNTER — Encounter: Payer: Self-pay | Admitting: Internal Medicine

## 2022-01-23 NOTE — Telephone Encounter (Signed)
I left voice mail message regarding her appt for allergy injections here tomorrow. We do not have the serums available. We did request more serum from [facility]. We have sent the vaccine records to [facility] but have not heard back. Also, my Director tells me that that most [place] primary care offices are referring these allergy injections to either [facility] or [facility]. Can discuss with patient. MJB, MD

## 2022-01-24 ENCOUNTER — Ambulatory Visit: Payer: BC Managed Care – PPO

## 2022-01-25 ENCOUNTER — Ambulatory Visit: Payer: BC Managed Care – PPO

## 2022-02-11 DIAGNOSIS — J301 Allergic rhinitis due to pollen: Secondary | ICD-10-CM | POA: Diagnosis not present

## 2022-02-11 DIAGNOSIS — J3081 Allergic rhinitis due to animal (cat) (dog) hair and dander: Secondary | ICD-10-CM | POA: Diagnosis not present

## 2022-02-11 DIAGNOSIS — J3089 Other allergic rhinitis: Secondary | ICD-10-CM | POA: Diagnosis not present

## 2022-02-18 DIAGNOSIS — J301 Allergic rhinitis due to pollen: Secondary | ICD-10-CM | POA: Diagnosis not present

## 2022-02-18 DIAGNOSIS — J3089 Other allergic rhinitis: Secondary | ICD-10-CM | POA: Diagnosis not present

## 2022-02-18 DIAGNOSIS — J3081 Allergic rhinitis due to animal (cat) (dog) hair and dander: Secondary | ICD-10-CM | POA: Diagnosis not present

## 2022-03-11 DIAGNOSIS — J3089 Other allergic rhinitis: Secondary | ICD-10-CM | POA: Diagnosis not present

## 2022-03-11 DIAGNOSIS — J301 Allergic rhinitis due to pollen: Secondary | ICD-10-CM | POA: Diagnosis not present

## 2022-03-11 DIAGNOSIS — J3081 Allergic rhinitis due to animal (cat) (dog) hair and dander: Secondary | ICD-10-CM | POA: Diagnosis not present

## 2022-03-18 DIAGNOSIS — J301 Allergic rhinitis due to pollen: Secondary | ICD-10-CM | POA: Diagnosis not present

## 2022-03-18 DIAGNOSIS — J3089 Other allergic rhinitis: Secondary | ICD-10-CM | POA: Diagnosis not present

## 2022-04-01 DIAGNOSIS — J3081 Allergic rhinitis due to animal (cat) (dog) hair and dander: Secondary | ICD-10-CM | POA: Diagnosis not present

## 2022-04-01 DIAGNOSIS — J3089 Other allergic rhinitis: Secondary | ICD-10-CM | POA: Diagnosis not present

## 2022-04-01 DIAGNOSIS — J301 Allergic rhinitis due to pollen: Secondary | ICD-10-CM | POA: Diagnosis not present

## 2022-04-19 DIAGNOSIS — J3081 Allergic rhinitis due to animal (cat) (dog) hair and dander: Secondary | ICD-10-CM | POA: Diagnosis not present

## 2022-04-19 DIAGNOSIS — J301 Allergic rhinitis due to pollen: Secondary | ICD-10-CM | POA: Diagnosis not present

## 2022-04-19 DIAGNOSIS — J3089 Other allergic rhinitis: Secondary | ICD-10-CM | POA: Diagnosis not present

## 2022-05-03 DIAGNOSIS — J301 Allergic rhinitis due to pollen: Secondary | ICD-10-CM | POA: Diagnosis not present

## 2022-05-03 DIAGNOSIS — J3089 Other allergic rhinitis: Secondary | ICD-10-CM | POA: Diagnosis not present

## 2022-05-03 DIAGNOSIS — J3081 Allergic rhinitis due to animal (cat) (dog) hair and dander: Secondary | ICD-10-CM | POA: Diagnosis not present

## 2022-05-19 DIAGNOSIS — J3089 Other allergic rhinitis: Secondary | ICD-10-CM | POA: Diagnosis not present

## 2022-05-19 DIAGNOSIS — J3081 Allergic rhinitis due to animal (cat) (dog) hair and dander: Secondary | ICD-10-CM | POA: Diagnosis not present

## 2022-05-19 DIAGNOSIS — J301 Allergic rhinitis due to pollen: Secondary | ICD-10-CM | POA: Diagnosis not present

## 2022-05-25 DIAGNOSIS — J301 Allergic rhinitis due to pollen: Secondary | ICD-10-CM | POA: Diagnosis not present

## 2022-05-25 DIAGNOSIS — J3081 Allergic rhinitis due to animal (cat) (dog) hair and dander: Secondary | ICD-10-CM | POA: Diagnosis not present

## 2022-05-25 DIAGNOSIS — J3089 Other allergic rhinitis: Secondary | ICD-10-CM | POA: Diagnosis not present

## 2022-06-02 DIAGNOSIS — J301 Allergic rhinitis due to pollen: Secondary | ICD-10-CM | POA: Diagnosis not present

## 2022-06-02 DIAGNOSIS — J3089 Other allergic rhinitis: Secondary | ICD-10-CM | POA: Diagnosis not present

## 2022-06-02 DIAGNOSIS — J3081 Allergic rhinitis due to animal (cat) (dog) hair and dander: Secondary | ICD-10-CM | POA: Diagnosis not present

## 2022-06-09 DIAGNOSIS — J301 Allergic rhinitis due to pollen: Secondary | ICD-10-CM | POA: Diagnosis not present

## 2022-06-09 DIAGNOSIS — J3089 Other allergic rhinitis: Secondary | ICD-10-CM | POA: Diagnosis not present

## 2022-06-11 ENCOUNTER — Other Ambulatory Visit: Payer: Self-pay | Admitting: Internal Medicine

## 2022-06-16 DIAGNOSIS — J3089 Other allergic rhinitis: Secondary | ICD-10-CM | POA: Diagnosis not present

## 2022-06-16 DIAGNOSIS — J3081 Allergic rhinitis due to animal (cat) (dog) hair and dander: Secondary | ICD-10-CM | POA: Diagnosis not present

## 2022-06-16 DIAGNOSIS — J301 Allergic rhinitis due to pollen: Secondary | ICD-10-CM | POA: Diagnosis not present

## 2022-07-01 DIAGNOSIS — J301 Allergic rhinitis due to pollen: Secondary | ICD-10-CM | POA: Diagnosis not present

## 2022-07-01 DIAGNOSIS — J3089 Other allergic rhinitis: Secondary | ICD-10-CM | POA: Diagnosis not present

## 2022-07-01 DIAGNOSIS — J3081 Allergic rhinitis due to animal (cat) (dog) hair and dander: Secondary | ICD-10-CM | POA: Diagnosis not present

## 2022-07-12 DIAGNOSIS — H1045 Other chronic allergic conjunctivitis: Secondary | ICD-10-CM | POA: Diagnosis not present

## 2022-07-12 DIAGNOSIS — J301 Allergic rhinitis due to pollen: Secondary | ICD-10-CM | POA: Diagnosis not present

## 2022-07-12 DIAGNOSIS — J3089 Other allergic rhinitis: Secondary | ICD-10-CM | POA: Diagnosis not present

## 2022-07-18 DIAGNOSIS — J3089 Other allergic rhinitis: Secondary | ICD-10-CM | POA: Diagnosis not present

## 2022-07-18 DIAGNOSIS — J3081 Allergic rhinitis due to animal (cat) (dog) hair and dander: Secondary | ICD-10-CM | POA: Diagnosis not present

## 2022-07-18 DIAGNOSIS — J301 Allergic rhinitis due to pollen: Secondary | ICD-10-CM | POA: Diagnosis not present

## 2022-08-12 DIAGNOSIS — J3089 Other allergic rhinitis: Secondary | ICD-10-CM | POA: Diagnosis not present

## 2022-08-12 DIAGNOSIS — J301 Allergic rhinitis due to pollen: Secondary | ICD-10-CM | POA: Diagnosis not present

## 2022-08-12 DIAGNOSIS — J3081 Allergic rhinitis due to animal (cat) (dog) hair and dander: Secondary | ICD-10-CM | POA: Diagnosis not present

## 2022-09-01 DIAGNOSIS — J301 Allergic rhinitis due to pollen: Secondary | ICD-10-CM | POA: Diagnosis not present

## 2022-09-01 DIAGNOSIS — J3081 Allergic rhinitis due to animal (cat) (dog) hair and dander: Secondary | ICD-10-CM | POA: Diagnosis not present

## 2022-09-01 DIAGNOSIS — J3089 Other allergic rhinitis: Secondary | ICD-10-CM | POA: Diagnosis not present

## 2022-09-10 ENCOUNTER — Other Ambulatory Visit: Payer: Self-pay | Admitting: Internal Medicine

## 2022-09-15 DIAGNOSIS — J301 Allergic rhinitis due to pollen: Secondary | ICD-10-CM | POA: Diagnosis not present

## 2022-09-15 DIAGNOSIS — J3089 Other allergic rhinitis: Secondary | ICD-10-CM | POA: Diagnosis not present

## 2022-09-30 ENCOUNTER — Encounter: Payer: Self-pay | Admitting: Internal Medicine

## 2022-09-30 DIAGNOSIS — Z1231 Encounter for screening mammogram for malignant neoplasm of breast: Secondary | ICD-10-CM | POA: Diagnosis not present

## 2022-09-30 LAB — HM MAMMOGRAPHY

## 2022-10-04 ENCOUNTER — Telehealth: Payer: Self-pay | Admitting: Internal Medicine

## 2022-10-04 DIAGNOSIS — J3081 Allergic rhinitis due to animal (cat) (dog) hair and dander: Secondary | ICD-10-CM | POA: Diagnosis not present

## 2022-10-04 DIAGNOSIS — J301 Allergic rhinitis due to pollen: Secondary | ICD-10-CM | POA: Diagnosis not present

## 2022-10-04 DIAGNOSIS — J3089 Other allergic rhinitis: Secondary | ICD-10-CM | POA: Diagnosis not present

## 2022-10-04 NOTE — Telephone Encounter (Signed)
Annice Pih called back and scheduled

## 2022-10-04 NOTE — Telephone Encounter (Signed)
LVM to CB to re-schedule PE, have 10/11/2022 @ 3;00 available wanted to see if that is a possibility.

## 2022-10-13 DIAGNOSIS — J301 Allergic rhinitis due to pollen: Secondary | ICD-10-CM | POA: Diagnosis not present

## 2022-10-13 DIAGNOSIS — J3089 Other allergic rhinitis: Secondary | ICD-10-CM | POA: Diagnosis not present

## 2022-10-17 DIAGNOSIS — J301 Allergic rhinitis due to pollen: Secondary | ICD-10-CM | POA: Diagnosis not present

## 2022-10-18 ENCOUNTER — Other Ambulatory Visit: Payer: BC Managed Care – PPO

## 2022-10-18 DIAGNOSIS — J3089 Other allergic rhinitis: Secondary | ICD-10-CM | POA: Diagnosis not present

## 2022-10-25 ENCOUNTER — Other Ambulatory Visit: Payer: BC Managed Care – PPO

## 2022-10-25 DIAGNOSIS — I10 Essential (primary) hypertension: Secondary | ICD-10-CM | POA: Diagnosis not present

## 2022-10-25 DIAGNOSIS — Z13220 Encounter for screening for lipoid disorders: Secondary | ICD-10-CM | POA: Diagnosis not present

## 2022-10-25 DIAGNOSIS — R5383 Other fatigue: Secondary | ICD-10-CM

## 2022-10-25 LAB — CBC WITH DIFFERENTIAL/PLATELET
Eosinophils Absolute: 217 cells/uL (ref 15–500)
Eosinophils Relative: 3.8 %
MPV: 10 fL (ref 7.5–12.5)
Monocytes Relative: 6.5 %
RBC: 4.25 10*6/uL (ref 3.80–5.10)
Total Lymphocyte: 22.3 %

## 2022-10-26 LAB — CBC WITH DIFFERENTIAL/PLATELET
Absolute Monocytes: 371 cells/uL (ref 200–950)
Basophils Absolute: 40 cells/uL (ref 0–200)
Basophils Relative: 0.7 %
HCT: 38.8 % (ref 35.0–45.0)
Hemoglobin: 12.8 g/dL (ref 11.7–15.5)
Lymphs Abs: 1271 cells/uL (ref 850–3900)
MCH: 30.1 pg (ref 27.0–33.0)
MCHC: 33 g/dL (ref 32.0–36.0)
MCV: 91.3 fL (ref 80.0–100.0)
Neutro Abs: 3802 cells/uL (ref 1500–7800)
Neutrophils Relative %: 66.7 %
Platelets: 290 10*3/uL (ref 140–400)
RDW: 12.1 % (ref 11.0–15.0)
WBC: 5.7 10*3/uL (ref 3.8–10.8)

## 2022-10-26 LAB — COMPLETE METABOLIC PANEL WITH GFR
AG Ratio: 2 (calc) (ref 1.0–2.5)
ALT: 16 U/L (ref 6–29)
AST: 18 U/L (ref 10–35)
Albumin: 4.5 g/dL (ref 3.6–5.1)
Alkaline phosphatase (APISO): 90 U/L (ref 37–153)
BUN: 18 mg/dL (ref 7–25)
CO2: 28 mmol/L (ref 20–32)
Calcium: 9.9 mg/dL (ref 8.6–10.4)
Chloride: 106 mmol/L (ref 98–110)
Creat: 0.69 mg/dL (ref 0.50–1.05)
Globulin: 2.2 g/dL (calc) (ref 1.9–3.7)
Glucose, Bld: 86 mg/dL (ref 65–99)
Potassium: 4.8 mmol/L (ref 3.5–5.3)
Sodium: 141 mmol/L (ref 135–146)
Total Bilirubin: 0.5 mg/dL (ref 0.2–1.2)
Total Protein: 6.7 g/dL (ref 6.1–8.1)
eGFR: 97 mL/min/{1.73_m2} (ref >=60)

## 2022-10-26 LAB — LIPID PANEL
Cholesterol: 194 mg/dL (ref <200)
HDL: 95 mg/dL (ref >=50)
LDL Cholesterol (Calc): 86 mg/dL (calc)
Non-HDL Cholesterol (Calc): 99 mg/dL (calc) (ref <130)
Total CHOL/HDL Ratio: 2 (calc) (ref <5.0)
Triglycerides: 45 mg/dL (ref <150)

## 2022-10-26 LAB — TSH: TSH: 1.33 mIU/L (ref 0.40–4.50)

## 2022-10-27 ENCOUNTER — Encounter: Payer: BC Managed Care – PPO | Admitting: Internal Medicine

## 2022-10-31 NOTE — Progress Notes (Signed)
Patient Care Team: Margaree Mackintosh, MD as PCP - General (Internal Medicine)  Visit Date: 11/07/22  Subjective:    Patient ID: YUDELKA LEH , Female   DOB: 1961-05-30, 62 y.o.    MRN: [id number]   62 y.o. Female presents today for annual comprehensive physical exam.  History of essential hypertension treated with valsartan 80 mg daily. Blood pressure slightly elevated today at 130/88.  In April 2021, had mild bout of diverticulitis over the Easter holiday.  History of diverticulitis in 2018.  Patient had colonoscopy in 2018 by Dr. Matthias Hughs which was normal with 10-year follow-up recommended.  She has environmental and seasonal allergies and receives immunotherapy here approximately every 2 weeks.  Her allergist is [facility].  She has environmental and seasonal allergies.   DEXA scan 2015 was normal.   She has a family history of lung cancer in her sister who is a non-smoker.   Patient had CT calcium score in April 2023.  Score was 0. Showed dilated ascending aorta up to 40 mm.   In June 2021, she was referred to Dr. Renaldo Fiddler at [facility] for evaluation of an incidental discovery of ascending aorta measuring 39 mm upper normal size on CT calcium score exam and he felt there were no worrisome features associated with this finding but suggested repeat CT in one year. Referral will be made back to Dr. Vickey Sages as she still has concerns about this finding.  She also saw Dr. Delton Coombes, Pulmonologist, for evaluation of Pulmonary nodules. Dr. Delton Coombes felt the nodules were stable from 2018. He did not feel further evaluation such as serial CTs were necessary.  She does not take flu vaccine due to history of egg allergy although reaction is only nausea.  Says she is only had flu and works and that was when she was a child.   History of migraine headaches that resolved after menopause.   History of scoliosis and receives massage therapy for issues with back pain.   In June 2016 she saw Dr.  Danella Deis for annual skin checkup.  There was an area in the right lateral lower thigh which was thought to be a lipoma and it was removed.  Pathology showed adipose tissue consistent with lipoma.  No atypia was observed.   No known drug allergies.   In 2015 she had chest pain, an EKG was normal.   Had appendectomy at [facility] in 1996.   Right shoulder surgery in 2002 in 2005.   History of lactose intolerance and irritable bowel syndrome.   She was seen in the emergency department with acute abdominal pain December 1997.  CT showed thickening of the midportion of the ascending colon.  Subsequently had colonoscopy in 1998 which was normal.  She was seen by Dr. Matthias Hughs at that time.   She had genetic testing at cancer center in October 2019.  She was heterozygous for MUTYH.  Was thought to have moderate increased risk for colon polyps but has had negative colonoscopies.  No other hereditary predisposition to cancer was identified with her consultation.  Glucose normal. Kidney, liver functions normal. Electrolytes normal. Blood proteins normal. CBC normal. Lipid panel normal. TSH at 1.33.  Mammogram last completed 09/30/22. No mammographic evidence of malignancy. Recommended repeat in 2025.  Colonoscopy last completed 02/06/17. Showed diverticulosis in sigmoid, ascending colon. Exam otherwise normal. Recommended repeat in 2028.   Social history: Married.  No children.  She is employed as a Water quality scientist.  Husband  is a Educational psychologist.  She does not smoke.  Social alcohol consumption.  She watches her weight and stays in good shape.  She goes to the gym.  Past Medical History:  Diagnosis Date   Allergic rhinitis    Egg allergy    Essential hypertension    Family history of lung cancer    Family history of prostate cancer    Migraines    Resolved after menopause   Scoliosis      Family History  Problem Relation Age of Onset   CAD Father        h/o bypass  in his 108s, possible heart issues in his 90s   Hypertension Father    Prostate cancer Father        was not metastatic, but his doctors were concerned it could spread   Heart failure Mother        Rheumatic fever, nonischemic   Hypertension Mother    Rheumatic fever Mother    Heart attack Paternal Grandmother    Heart disease Paternal Grandfather    Hypertension Sister    Lung cancer Sister 57       non smoker- had genetic testing and was positve for a gene- thinks it was EGFR?   Heart attack Paternal Aunt    Kidney disease Paternal Uncle    Other Paternal Kateri Mc        'ruptured aorta'    Social History   Social History Narrative   Not on file      Review of Systems  Constitutional:  Negative for chills, fever, malaise/fatigue and weight loss.  HENT:  Negative for hearing loss, sinus pain and sore throat.   Respiratory:  Negative for cough, hemoptysis and shortness of breath.   Cardiovascular:  Negative for chest pain, palpitations, leg swelling and PND.  Gastrointestinal:  Negative for abdominal pain, constipation, diarrhea, heartburn, nausea and vomiting.  Genitourinary:  Negative for dysuria, frequency and urgency.  Musculoskeletal:  Negative for back pain, myalgias and neck pain.  Skin:  Negative for itching and rash.  Neurological:  Negative for dizziness, tingling, seizures and headaches.  Endo/Heme/Allergies:  Negative for polydipsia.  Psychiatric/Behavioral:  Negative for depression. The patient is not nervous/anxious.         Objective:   Vitals: BP (!) 130/90   Pulse 75   Temp 98.3 F (36.8 C) (Tympanic)   Resp 16   Ht 5' 1.75" (1.568 m)   Wt 151 lb 8 oz (68.7 kg)   LMP 10/11/2011   SpO2 98%   BMI 27.93 kg/m    Physical Exam Vitals and nursing note reviewed.  Constitutional:      General: She is not in acute distress.    Appearance: Normal appearance. She is not ill-appearing or toxic-appearing.  HENT:     Head: Normocephalic and atraumatic.      Right Ear: Hearing, tympanic membrane, ear canal and external ear normal.     Left Ear: Hearing, tympanic membrane, ear canal and external ear normal.     Mouth/Throat:     Pharynx: Oropharynx is clear.  Eyes:     Extraocular Movements: Extraocular movements intact.     Pupils: Pupils are equal, round, and reactive to light.  Neck:     Thyroid: No thyroid mass, thyromegaly or thyroid tenderness.     Vascular: No carotid bruit.  Cardiovascular:     Rate and Rhythm: Normal rate and regular rhythm. No extrasystoles are present.    Pulses:  Dorsalis pedis pulses are 2+ on the right side and 2+ on the left side.       Posterior tibial pulses are 1+ on the right side and 1+ on the left side.     Heart sounds: Normal heart sounds. No murmur heard.    No friction rub. No gallop.     Comments: Several varicosities in legs. Pulmonary:     Effort: Pulmonary effort is normal.     Breath sounds: Normal breath sounds. No decreased breath sounds, wheezing, rhonchi or rales.  Chest:     Chest wall: No mass.  Abdominal:     Palpations: Abdomen is soft. There is no hepatomegaly, splenomegaly or mass.     Tenderness: There is no abdominal tenderness.     Hernia: No hernia is present.  Genitourinary:    Comments: NFEG. Bimanual exam normal. Pap taken. Musculoskeletal:     Cervical back: Normal range of motion.     Right lower leg: No edema.     Left lower leg: No edema.  Lymphadenopathy:     Cervical: No cervical adenopathy.     Upper Body:     Right upper body: No supraclavicular adenopathy.     Left upper body: No supraclavicular adenopathy.  Skin:    General: Skin is warm and dry.  Neurological:     General: No focal deficit present.     Mental Status: She is alert and oriented to person, place, and time. Mental status is at baseline.     Sensory: Sensation is intact.     Motor: Motor function is intact. No weakness.     Deep Tendon Reflexes: Reflexes are normal and  symmetric.  Psychiatric:        Attention and Perception: Attention normal.        Mood and Affect: Mood normal.        Speech: Speech normal.        Behavior: Behavior normal.        Thought Content: Thought content normal.        Cognition and Memory: Cognition normal.        Judgment: Judgment normal.       Results:   Studies obtained and personally reviewed by me:  Mammogram last completed 09/30/22. No mammographic evidence of malignancy. Recommended repeat in 2025.  Colonoscopy last completed 02/06/17. Showed diverticulosis in sigmoid, ascending colon. Exam otherwise normal. Recommended repeat in 2028.  Labs:       Component Value Date/Time   NA 141 10/25/2022 0905   K 4.8 10/25/2022 0905   CL 106 10/25/2022 0905   CO2 28 10/25/2022 0905   GLUCOSE 86 10/25/2022 0905   BUN 18 10/25/2022 0905   CREATININE 0.69 10/25/2022 0905   CALCIUM 9.9 10/25/2022 0905   PROT 6.7 10/25/2022 0905   ALBUMIN 4.3 06/02/2016 1030   AST 18 10/25/2022 0905   ALT 16 10/25/2022 0905   ALKPHOS 64 06/02/2016 1030   BILITOT 0.5 10/25/2022 0905   GFRNONAA 95 09/24/2020 0909   GFRAA 110 09/24/2020 0909     Lab Results  Component Value Date   WBC 5.7 10/25/2022   HGB 12.8 10/25/2022   HCT 38.8 10/25/2022   MCV 91.3 10/25/2022   PLT 290 10/25/2022    Lab Results  Component Value Date   CHOL 194 10/25/2022   HDL 95 10/25/2022   LDLCALC 86 10/25/2022   TRIG 45 10/25/2022   CHOLHDL 2.0 10/25/2022    No results found  for: "HGBA1C"   Lab Results  Component Value Date   TSH 1.33 10/25/2022      Assessment & Plan:   Essential hypertension: treated with valsartan 80 mg daily. Blood pressure slightly elevated today at 130/88. She is concerned about this. Will continue to monitor and she will call with home blood pressure readings.  Mild dilatation of ascending aorta thought to be stable. Follow-up with [facility] on 12/15/22.  Mammogram: last completed 09/30/22. No mammographic evidence of  malignancy. Recommended repeat in 2025.  Colonoscopy: last completed 02/06/17. Showed diverticulosis in sigmoid, ascending colon. Exam otherwise normal. Recommended repeat in 2028.  DEXA scan 2015 was normal.  Vaccine counseling: administered tetanus vaccine.  Return in 1 year for health maintenance exam or as needed.    I,Alexander Ruley,acting as a Neurosurgeon for Margaree Mackintosh, MD.,have documented all relevant documentation on the behalf of Margaree Mackintosh, MD,as directed by  Margaree Mackintosh, MD while in the presence of Margaree Mackintosh, MD.   I, Margaree Mackintosh, MD, have reviewed all documentation for this visit. The documentation on 11/20/22 for the exam, diagnosis, procedures, and orders are all accurate and complete.

## 2022-11-03 DIAGNOSIS — J301 Allergic rhinitis due to pollen: Secondary | ICD-10-CM | POA: Diagnosis not present

## 2022-11-03 DIAGNOSIS — J3089 Other allergic rhinitis: Secondary | ICD-10-CM | POA: Diagnosis not present

## 2022-11-03 DIAGNOSIS — J3081 Allergic rhinitis due to animal (cat) (dog) hair and dander: Secondary | ICD-10-CM | POA: Diagnosis not present

## 2022-11-04 ENCOUNTER — Other Ambulatory Visit: Payer: Self-pay | Admitting: Cardiothoracic Surgery

## 2022-11-04 DIAGNOSIS — I7121 Aneurysm of the ascending aorta, without rupture: Secondary | ICD-10-CM

## 2022-11-07 ENCOUNTER — Ambulatory Visit (INDEPENDENT_AMBULATORY_CARE_PROVIDER_SITE_OTHER): Payer: BC Managed Care – PPO | Admitting: Internal Medicine

## 2022-11-07 ENCOUNTER — Encounter: Payer: Self-pay | Admitting: Internal Medicine

## 2022-11-07 ENCOUNTER — Other Ambulatory Visit (HOSPITAL_COMMUNITY)
Admission: RE | Admit: 2022-11-07 | Discharge: 2022-11-07 | Disposition: A | Discharge status: Home / Self Care | Payer: BC Managed Care – PPO | Source: Ambulatory Visit | Attending: Internal Medicine | Admitting: Internal Medicine

## 2022-11-07 VITALS — BP 130/90 | HR 75 | Temp 98.3°F | Resp 16 | Ht 61.75 in | Wt 151.5 lb

## 2022-11-07 DIAGNOSIS — Z124 Encounter for screening for malignant neoplasm of cervix: Secondary | ICD-10-CM | POA: Diagnosis not present

## 2022-11-07 DIAGNOSIS — Z0000 Encounter for general adult medical examination without abnormal findings: Secondary | ICD-10-CM | POA: Diagnosis not present

## 2022-11-07 DIAGNOSIS — Z87898 Personal history of other specified conditions: Secondary | ICD-10-CM

## 2022-11-07 DIAGNOSIS — Z23 Encounter for immunization: Secondary | ICD-10-CM

## 2022-11-07 DIAGNOSIS — Z801 Family history of malignant neoplasm of trachea, bronchus and lung: Secondary | ICD-10-CM

## 2022-11-07 DIAGNOSIS — I10 Essential (primary) hypertension: Secondary | ICD-10-CM

## 2022-11-07 DIAGNOSIS — Z9109 Other allergy status, other than to drugs and biological substances: Secondary | ICD-10-CM | POA: Diagnosis not present

## 2022-11-07 DIAGNOSIS — Z8719 Personal history of other diseases of the digestive system: Secondary | ICD-10-CM | POA: Diagnosis not present

## 2022-11-07 DIAGNOSIS — Z803 Family history of malignant neoplasm of breast: Secondary | ICD-10-CM

## 2022-11-07 DIAGNOSIS — J301 Allergic rhinitis due to pollen: Secondary | ICD-10-CM

## 2022-11-08 DIAGNOSIS — Z23 Encounter for immunization: Secondary | ICD-10-CM

## 2022-11-08 DIAGNOSIS — Z0000 Encounter for general adult medical examination without abnormal findings: Secondary | ICD-10-CM | POA: Diagnosis not present

## 2022-11-10 LAB — CYTOLOGY - PAP
Comment: NEGATIVE
Diagnosis: NEGATIVE
High risk HPV: NEGATIVE

## 2022-11-20 NOTE — Patient Instructions (Addendum)
It was a pleasure to see you today.  Labs are stable.  You will be having [facility] follow-up soon.  No change in medications.  Tetanus immunization update given today.  Colonoscopy not due until 2028.  Continue to have annual mammogram.  Continue immunotherapy per Allergist.  Pap smear taken and is normal

## 2022-12-02 ENCOUNTER — Other Ambulatory Visit: Payer: Self-pay | Admitting: Internal Medicine

## 2022-12-08 DIAGNOSIS — J3089 Other allergic rhinitis: Secondary | ICD-10-CM | POA: Diagnosis not present

## 2022-12-08 DIAGNOSIS — J301 Allergic rhinitis due to pollen: Secondary | ICD-10-CM | POA: Diagnosis not present

## 2022-12-08 DIAGNOSIS — J3081 Allergic rhinitis due to animal (cat) (dog) hair and dander: Secondary | ICD-10-CM | POA: Diagnosis not present

## 2022-12-08 DIAGNOSIS — M25551 Pain in right hip: Secondary | ICD-10-CM | POA: Diagnosis not present

## 2022-12-08 DIAGNOSIS — M4186 Other forms of scoliosis, lumbar region: Secondary | ICD-10-CM | POA: Diagnosis not present

## 2022-12-08 NOTE — Patient Instructions (Signed)
Patient is counseled regarding the importance of long term risk factor modification as they pertain to the presence of ischemic heart disease including avoiding the use of all tobacco products, dietary modifications and medical therapy for diabetes, cholesterol and lipid management, and regular exercise.     Make every effort to maintain a "heart-healthy" lifestyle with regular physical exercise and adherence to a low-fat, low-carbohydrate diet.  Continue to seek regular follow-up appointments with your primary care physician and/or cardiologist.     AVOID FLUOROQUINOLONES (ex: Ciprofloxacin) AS THESE CAN INCREASE RISK OF INFECTION

## 2022-12-08 NOTE — Progress Notes (Signed)
[address]       [place] [address]             [phone]        Ann Chambers [id number] June 23, 1960  History of Present Illness:  Ann Chambers is a 62 yo female with history of Essential HTN.  She was incidentally found to have an ectasia of the ascending aorta measuring 3.9 cm which has been present since 2018.  She was also noted to have benign pulmonary nodules  She was last seen in our office in July of 2023 at which time her imaging revealed the ectasia to be stable.  She presents today for 1 year follow up.  Overall she is doing well.  She has some mild pain along left upper chest.  She had a calcium CT scan with a score of 0.  She then had her routine mammogram which showed some calcium in the arteries of her breast and ask if she should be concerned about it.  I explained that this is not related to her aneurysm.  Current Outpatient Medications on File Prior to Visit  Medication Sig Dispense Refill   ADVANCED EVENING PRIMROSE OIL PO Take 1,300 mg by mouth daily.     co-enzyme Q-10 30 MG capsule Take 100 mg by mouth daily.     ELDERBERRY PO Take 1 capsule by mouth daily. 400 mg Black Elderberry with Vitamin C 100 mg and Zinc 7.5mg      EPIPEN 2-PAK 0.3 MG/0.3ML DEVI      Multiple Vitamins-Minerals (PRESERVISION AREDS 2) CAPS Take 2 capsules by mouth daily.     Omega 3 1200 MG CAPS Take by mouth.     valsartan (DIOVAN) 80 MG tablet TAKE 1 TABLET BY MOUTH EVERY DAY 90 tablet 0   No current facility-administered medications on file prior to visit.     LMP 10/11/2011   Physical Exam  BP (!) 138/90 (BP Location: Left Arm, Patient Position: Sitting, Cuff Size: Normal)   Pulse 69   Resp 20   Ht 5' 1.75" (1.568 m)   Wt 149 lb 14.4 oz (68 kg)   LMP 10/11/2011   SpO2 98% Comment: RA  BMI 27.64 kg/m    CTA Results:  EXAM: CT CHEST WITHOUT CONTRAST   TECHNIQUE: Multidetector CT imaging of the chest was performed following  the standard protocol without IV contrast.   RADIATION DOSE REDUCTION: This exam was performed according to the departmental dose-optimization program which includes automated exposure control, adjustment of the mA and/or kV according to patient size and/or use of iterative reconstruction technique.   COMPARISON:  Previous studies including cardiac CT done on 10/28/2021   FINDINGS: Cardiovascular: There is ectasia of the ascending thoracic aorta measuring 4.1 cm. Lumen is not evaluated in this noncontrast study. Aortic arch measures 2.8 cm. Descending thoracic aorta measures 2.5 cm in diameter. There are small scattered calcifications in thoracic aorta.   Mediastinum/Nodes: No significant lymphadenopathy is seen.   Lungs/Pleura: There is no focal pulmonary consolidation. There are small scattered pleural plaques in the upper lung fields with no significant change. No discrete lung nodules are seen. There is no pleural effusion or pneumothorax.   Upper Abdomen: Multiple diverticula as seen in colon.   Musculoskeletal: No acute findings are seen.   IMPRESSION: There is 4.1 cm aneurysm in ascending thoracic aorta. Recommend annual imaging followup by CTA or MRA. This recommendation follows 2010 ACCF/AHA/AATS/ACR/ASA/SCA/SCAI/SIR/STS/SVM Guidelines for the Diagnosis and  Management of Patients with Thoracic Aortic Disease. Circulation. 2010; 121: W098-J191. Aortic aneurysm NOS (ICD10-I71.9)   There is no focal pulmonary consolidation. There is no pleural effusion or pneumothorax.   Diverticulosis of colon.     Electronically Signed   By: Ernie Avena M.D.   On: 12/15/2022 12:23    A/P:  Ectasia of Ascending Aortic Aneurysm- present since 2018, measuring stable at 4.1 Benign pulmonary nodules- not present on current scan Essential HTN-compliant with regimen  Risk Modification:  Statin:  No, can discuss need/indication with Primary care physician  Smoking  cessation instruction/counseling given:  Never Smoker  Patient was counseled on importance of Blood Pressure Control.  Despite Medical intervention if the patient notices persistently elevated blood pressure readings.  They are instructed to contact their Primary Care Physician  Please avoid use of Fluoroquinolones as this can potentially increase your risk of Aortic Rupture and/or Dissection  Patient educated on signs and symptoms of Aortic Dissection, handout also provided in [facility]  RTC in 1 year with repeat scan  Lowella Dandy, PA-C 12/08/22

## 2022-12-09 ENCOUNTER — Encounter: Payer: Self-pay | Admitting: Cardiothoracic Surgery

## 2022-12-15 ENCOUNTER — Ambulatory Visit
Admission: RE | Admit: 2022-12-15 | Discharge: 2022-12-15 | Disposition: A | Discharge status: Home / Self Care | Payer: BC Managed Care – PPO | Source: Ambulatory Visit | Attending: Cardiothoracic Surgery | Admitting: Cardiothoracic Surgery

## 2022-12-15 ENCOUNTER — Ambulatory Visit: Payer: BC Managed Care – PPO | Admitting: Physician Assistant

## 2022-12-15 VITALS — BP 138/90 | HR 69 | Resp 20 | Ht 61.75 in | Wt 149.9 lb

## 2022-12-15 DIAGNOSIS — I7121 Aneurysm of the ascending aorta, without rupture: Secondary | ICD-10-CM

## 2022-12-23 DIAGNOSIS — J3089 Other allergic rhinitis: Secondary | ICD-10-CM | POA: Diagnosis not present

## 2022-12-23 DIAGNOSIS — J301 Allergic rhinitis due to pollen: Secondary | ICD-10-CM | POA: Diagnosis not present

## 2022-12-23 DIAGNOSIS — J3081 Allergic rhinitis due to animal (cat) (dog) hair and dander: Secondary | ICD-10-CM | POA: Diagnosis not present

## 2023-01-03 DIAGNOSIS — M25551 Pain in right hip: Secondary | ICD-10-CM | POA: Diagnosis not present

## 2023-01-03 DIAGNOSIS — M5451 Vertebrogenic low back pain: Secondary | ICD-10-CM | POA: Diagnosis not present

## 2023-01-06 DIAGNOSIS — J301 Allergic rhinitis due to pollen: Secondary | ICD-10-CM | POA: Diagnosis not present

## 2023-01-06 DIAGNOSIS — J3081 Allergic rhinitis due to animal (cat) (dog) hair and dander: Secondary | ICD-10-CM | POA: Diagnosis not present

## 2023-01-06 DIAGNOSIS — J3089 Other allergic rhinitis: Secondary | ICD-10-CM | POA: Diagnosis not present

## 2023-01-12 DIAGNOSIS — J3089 Other allergic rhinitis: Secondary | ICD-10-CM | POA: Diagnosis not present

## 2023-01-12 DIAGNOSIS — J301 Allergic rhinitis due to pollen: Secondary | ICD-10-CM | POA: Diagnosis not present

## 2023-01-19 DIAGNOSIS — M1611 Unilateral primary osteoarthritis, right hip: Secondary | ICD-10-CM | POA: Diagnosis not present

## 2023-01-19 DIAGNOSIS — M5451 Vertebrogenic low back pain: Secondary | ICD-10-CM | POA: Diagnosis not present

## 2023-02-02 ENCOUNTER — Other Ambulatory Visit: Payer: BC Managed Care – PPO

## 2023-02-03 ENCOUNTER — Encounter: Payer: BC Managed Care – PPO | Admitting: Internal Medicine

## 2023-02-08 DIAGNOSIS — J301 Allergic rhinitis due to pollen: Secondary | ICD-10-CM | POA: Diagnosis not present

## 2023-02-08 DIAGNOSIS — J3089 Other allergic rhinitis: Secondary | ICD-10-CM | POA: Diagnosis not present

## 2023-02-14 DIAGNOSIS — J301 Allergic rhinitis due to pollen: Secondary | ICD-10-CM | POA: Diagnosis not present

## 2023-02-14 DIAGNOSIS — J3089 Other allergic rhinitis: Secondary | ICD-10-CM | POA: Diagnosis not present

## 2023-02-24 DIAGNOSIS — J301 Allergic rhinitis due to pollen: Secondary | ICD-10-CM | POA: Diagnosis not present

## 2023-02-24 DIAGNOSIS — J3089 Other allergic rhinitis: Secondary | ICD-10-CM | POA: Diagnosis not present

## 2023-02-24 DIAGNOSIS — J3081 Allergic rhinitis due to animal (cat) (dog) hair and dander: Secondary | ICD-10-CM | POA: Diagnosis not present

## 2023-02-25 ENCOUNTER — Other Ambulatory Visit: Payer: Self-pay | Admitting: Family

## 2023-03-06 DIAGNOSIS — J301 Allergic rhinitis due to pollen: Secondary | ICD-10-CM | POA: Diagnosis not present

## 2023-03-06 DIAGNOSIS — J3081 Allergic rhinitis due to animal (cat) (dog) hair and dander: Secondary | ICD-10-CM | POA: Diagnosis not present

## 2023-03-06 DIAGNOSIS — J3089 Other allergic rhinitis: Secondary | ICD-10-CM | POA: Diagnosis not present

## 2023-03-16 DIAGNOSIS — J3089 Other allergic rhinitis: Secondary | ICD-10-CM | POA: Diagnosis not present

## 2023-03-16 DIAGNOSIS — J3081 Allergic rhinitis due to animal (cat) (dog) hair and dander: Secondary | ICD-10-CM | POA: Diagnosis not present

## 2023-03-16 DIAGNOSIS — J301 Allergic rhinitis due to pollen: Secondary | ICD-10-CM | POA: Diagnosis not present

## 2023-03-23 DIAGNOSIS — J3089 Other allergic rhinitis: Secondary | ICD-10-CM | POA: Diagnosis not present

## 2023-03-23 DIAGNOSIS — J301 Allergic rhinitis due to pollen: Secondary | ICD-10-CM | POA: Diagnosis not present

## 2023-03-23 DIAGNOSIS — J3081 Allergic rhinitis due to animal (cat) (dog) hair and dander: Secondary | ICD-10-CM | POA: Diagnosis not present

## 2023-03-31 DIAGNOSIS — J301 Allergic rhinitis due to pollen: Secondary | ICD-10-CM | POA: Diagnosis not present

## 2023-03-31 DIAGNOSIS — J3081 Allergic rhinitis due to animal (cat) (dog) hair and dander: Secondary | ICD-10-CM | POA: Diagnosis not present

## 2023-03-31 DIAGNOSIS — J3089 Other allergic rhinitis: Secondary | ICD-10-CM | POA: Diagnosis not present

## 2023-04-10 DIAGNOSIS — J301 Allergic rhinitis due to pollen: Secondary | ICD-10-CM | POA: Diagnosis not present

## 2023-04-10 DIAGNOSIS — J3081 Allergic rhinitis due to animal (cat) (dog) hair and dander: Secondary | ICD-10-CM | POA: Diagnosis not present

## 2023-04-10 DIAGNOSIS — J3089 Other allergic rhinitis: Secondary | ICD-10-CM | POA: Diagnosis not present

## 2023-05-02 DIAGNOSIS — J301 Allergic rhinitis due to pollen: Secondary | ICD-10-CM | POA: Diagnosis not present

## 2023-05-02 DIAGNOSIS — J3081 Allergic rhinitis due to animal (cat) (dog) hair and dander: Secondary | ICD-10-CM | POA: Diagnosis not present

## 2023-05-02 DIAGNOSIS — J3089 Other allergic rhinitis: Secondary | ICD-10-CM | POA: Diagnosis not present

## 2023-05-29 DIAGNOSIS — J3089 Other allergic rhinitis: Secondary | ICD-10-CM | POA: Diagnosis not present

## 2023-05-29 DIAGNOSIS — J3081 Allergic rhinitis due to animal (cat) (dog) hair and dander: Secondary | ICD-10-CM | POA: Diagnosis not present

## 2023-05-29 DIAGNOSIS — J301 Allergic rhinitis due to pollen: Secondary | ICD-10-CM | POA: Diagnosis not present

## 2023-06-07 DIAGNOSIS — J3081 Allergic rhinitis due to animal (cat) (dog) hair and dander: Secondary | ICD-10-CM | POA: Diagnosis not present

## 2023-06-07 DIAGNOSIS — J301 Allergic rhinitis due to pollen: Secondary | ICD-10-CM | POA: Diagnosis not present

## 2023-06-07 DIAGNOSIS — J3089 Other allergic rhinitis: Secondary | ICD-10-CM | POA: Diagnosis not present

## 2023-06-13 DIAGNOSIS — J3089 Other allergic rhinitis: Secondary | ICD-10-CM | POA: Diagnosis not present

## 2023-06-13 DIAGNOSIS — J301 Allergic rhinitis due to pollen: Secondary | ICD-10-CM | POA: Diagnosis not present

## 2023-06-23 DIAGNOSIS — J3089 Other allergic rhinitis: Secondary | ICD-10-CM | POA: Diagnosis not present

## 2023-06-23 DIAGNOSIS — J3081 Allergic rhinitis due to animal (cat) (dog) hair and dander: Secondary | ICD-10-CM | POA: Diagnosis not present

## 2023-06-23 DIAGNOSIS — J301 Allergic rhinitis due to pollen: Secondary | ICD-10-CM | POA: Diagnosis not present

## 2023-07-06 DIAGNOSIS — J3089 Other allergic rhinitis: Secondary | ICD-10-CM | POA: Diagnosis not present

## 2023-07-06 DIAGNOSIS — J301 Allergic rhinitis due to pollen: Secondary | ICD-10-CM | POA: Diagnosis not present

## 2023-07-06 DIAGNOSIS — J3081 Allergic rhinitis due to animal (cat) (dog) hair and dander: Secondary | ICD-10-CM | POA: Diagnosis not present

## 2023-07-17 DIAGNOSIS — J3089 Other allergic rhinitis: Secondary | ICD-10-CM | POA: Diagnosis not present

## 2023-07-17 DIAGNOSIS — J3081 Allergic rhinitis due to animal (cat) (dog) hair and dander: Secondary | ICD-10-CM | POA: Diagnosis not present

## 2023-07-17 DIAGNOSIS — J301 Allergic rhinitis due to pollen: Secondary | ICD-10-CM | POA: Diagnosis not present

## 2023-07-27 ENCOUNTER — Ambulatory Visit: Payer: BC Managed Care – PPO | Admitting: Internal Medicine

## 2023-08-02 DIAGNOSIS — J3089 Other allergic rhinitis: Secondary | ICD-10-CM | POA: Diagnosis not present

## 2023-08-02 DIAGNOSIS — J301 Allergic rhinitis due to pollen: Secondary | ICD-10-CM | POA: Diagnosis not present

## 2023-08-09 DIAGNOSIS — J301 Allergic rhinitis due to pollen: Secondary | ICD-10-CM | POA: Diagnosis not present

## 2023-08-09 DIAGNOSIS — J3089 Other allergic rhinitis: Secondary | ICD-10-CM | POA: Diagnosis not present

## 2023-08-09 DIAGNOSIS — J3081 Allergic rhinitis due to animal (cat) (dog) hair and dander: Secondary | ICD-10-CM | POA: Diagnosis not present

## 2023-08-18 DIAGNOSIS — H1045 Other chronic allergic conjunctivitis: Secondary | ICD-10-CM | POA: Diagnosis not present

## 2023-08-18 DIAGNOSIS — J301 Allergic rhinitis due to pollen: Secondary | ICD-10-CM | POA: Diagnosis not present

## 2023-08-18 DIAGNOSIS — J3089 Other allergic rhinitis: Secondary | ICD-10-CM | POA: Diagnosis not present

## 2023-08-22 ENCOUNTER — Other Ambulatory Visit: Payer: Self-pay | Admitting: Internal Medicine

## 2023-08-25 DIAGNOSIS — J3081 Allergic rhinitis due to animal (cat) (dog) hair and dander: Secondary | ICD-10-CM | POA: Diagnosis not present

## 2023-08-25 DIAGNOSIS — J3089 Other allergic rhinitis: Secondary | ICD-10-CM | POA: Diagnosis not present

## 2023-08-25 DIAGNOSIS — J301 Allergic rhinitis due to pollen: Secondary | ICD-10-CM | POA: Diagnosis not present

## 2023-09-08 DIAGNOSIS — J3089 Other allergic rhinitis: Secondary | ICD-10-CM | POA: Diagnosis not present

## 2023-09-08 DIAGNOSIS — J3081 Allergic rhinitis due to animal (cat) (dog) hair and dander: Secondary | ICD-10-CM | POA: Diagnosis not present

## 2023-09-08 DIAGNOSIS — J301 Allergic rhinitis due to pollen: Secondary | ICD-10-CM | POA: Diagnosis not present

## 2023-10-05 DIAGNOSIS — M25552 Pain in left hip: Secondary | ICD-10-CM | POA: Diagnosis not present

## 2023-10-06 DIAGNOSIS — Z1231 Encounter for screening mammogram for malignant neoplasm of breast: Secondary | ICD-10-CM | POA: Diagnosis not present

## 2023-10-06 LAB — HM MAMMOGRAPHY

## 2023-10-09 ENCOUNTER — Encounter: Payer: Self-pay | Admitting: Internal Medicine

## 2023-10-17 DIAGNOSIS — J3089 Other allergic rhinitis: Secondary | ICD-10-CM | POA: Diagnosis not present

## 2023-10-17 DIAGNOSIS — J3081 Allergic rhinitis due to animal (cat) (dog) hair and dander: Secondary | ICD-10-CM | POA: Diagnosis not present

## 2023-10-17 DIAGNOSIS — J301 Allergic rhinitis due to pollen: Secondary | ICD-10-CM | POA: Diagnosis not present

## 2023-10-25 DIAGNOSIS — J301 Allergic rhinitis due to pollen: Secondary | ICD-10-CM | POA: Diagnosis not present

## 2023-10-25 DIAGNOSIS — J3089 Other allergic rhinitis: Secondary | ICD-10-CM | POA: Diagnosis not present

## 2023-10-25 DIAGNOSIS — J3081 Allergic rhinitis due to animal (cat) (dog) hair and dander: Secondary | ICD-10-CM | POA: Diagnosis not present

## 2023-11-07 ENCOUNTER — Other Ambulatory Visit: Payer: BC Managed Care – PPO

## 2023-11-07 DIAGNOSIS — Z1329 Encounter for screening for other suspected endocrine disorder: Secondary | ICD-10-CM

## 2023-11-07 DIAGNOSIS — Z0000 Encounter for general adult medical examination without abnormal findings: Secondary | ICD-10-CM | POA: Diagnosis not present

## 2023-11-07 DIAGNOSIS — I10 Essential (primary) hypertension: Secondary | ICD-10-CM | POA: Diagnosis not present

## 2023-11-07 DIAGNOSIS — Z13220 Encounter for screening for lipoid disorders: Secondary | ICD-10-CM

## 2023-11-07 LAB — COMPLETE METABOLIC PANEL WITHOUT GFR
AG Ratio: 2 (calc) (ref 1.0–2.5)
ALT: 18 U/L (ref 6–29)
AST: 20 U/L (ref 10–35)
Albumin: 4.4 g/dL (ref 3.6–5.1)
Alkaline phosphatase (APISO): 80 U/L (ref 37–153)
BUN: 18 mg/dL (ref 7–25)
CO2: 25 mmol/L (ref 20–32)
Calcium: 9.4 mg/dL (ref 8.6–10.4)
Chloride: 106 mmol/L (ref 98–110)
Creat: 0.64 mg/dL (ref 0.50–1.05)
Globulin: 2.2 g/dL (ref 1.9–3.7)
Glucose, Bld: 87 mg/dL (ref 65–99)
Potassium: 4.4 mmol/L (ref 3.5–5.3)
Sodium: 140 mmol/L (ref 135–146)
Total Bilirubin: 0.5 mg/dL (ref 0.2–1.2)
Total Protein: 6.6 g/dL (ref 6.1–8.1)

## 2023-11-07 LAB — LIPID PANEL
Cholesterol: 180 mg/dL (ref <200)
HDL: 93 mg/dL (ref >=50)
LDL Cholesterol (Calc): 74 mg/dL
Non-HDL Cholesterol (Calc): 87 mg/dL (ref <130)
Total CHOL/HDL Ratio: 1.9 (calc) (ref <5.0)
Triglycerides: 47 mg/dL (ref <150)

## 2023-11-07 LAB — CBC WITH DIFFERENTIAL/PLATELET
Absolute Lymphocytes: 1567 {cells}/uL (ref 850–3900)
Absolute Monocytes: 390 {cells}/uL (ref 200–950)
Basophils Absolute: 39 {cells}/uL (ref 0–200)
Basophils Relative: 0.6 %
Eosinophils Absolute: 267 {cells}/uL (ref 15–500)
Eosinophils Relative: 4.1 %
HCT: 39.7 % (ref 35.0–45.0)
Hemoglobin: 12.7 g/dL (ref 11.7–15.5)
MCH: 29.9 pg (ref 27.0–33.0)
MCHC: 32 g/dL (ref 32.0–36.0)
MCV: 93.4 fL (ref 80.0–100.0)
MPV: 9.9 fL (ref 7.5–12.5)
Monocytes Relative: 6 %
Neutro Abs: 4238 {cells}/uL (ref 1500–7800)
Neutrophils Relative %: 65.2 %
Platelets: 283 10*3/uL (ref 140–400)
RBC: 4.25 10*6/uL (ref 3.80–5.10)
RDW: 12.2 % (ref 11.0–15.0)
Total Lymphocyte: 24.1 %
WBC: 6.5 10*3/uL (ref 3.8–10.8)

## 2023-11-07 LAB — TSH: TSH: 1.58 m[IU]/L (ref 0.40–4.50)

## 2023-11-09 ENCOUNTER — Encounter: Payer: BC Managed Care – PPO | Admitting: Internal Medicine

## 2023-11-09 ENCOUNTER — Other Ambulatory Visit: Payer: Self-pay | Admitting: Thoracic Surgery (Cardiothoracic Vascular Surgery)

## 2023-11-09 DIAGNOSIS — I7121 Aneurysm of the ascending aorta, without rupture: Secondary | ICD-10-CM

## 2023-11-16 DIAGNOSIS — J3089 Other allergic rhinitis: Secondary | ICD-10-CM | POA: Diagnosis not present

## 2023-11-16 DIAGNOSIS — J3081 Allergic rhinitis due to animal (cat) (dog) hair and dander: Secondary | ICD-10-CM | POA: Diagnosis not present

## 2023-11-16 DIAGNOSIS — J301 Allergic rhinitis due to pollen: Secondary | ICD-10-CM | POA: Diagnosis not present

## 2023-11-17 ENCOUNTER — Ambulatory Visit: Admitting: Internal Medicine

## 2023-11-17 VITALS — BP 134/86 | HR 77 | Temp 98.6°F | Ht 61.25 in | Wt 150.4 lb

## 2023-11-17 DIAGNOSIS — Z9109 Other allergy status, other than to drugs and biological substances: Secondary | ICD-10-CM | POA: Diagnosis not present

## 2023-11-17 DIAGNOSIS — Z801 Family history of malignant neoplasm of trachea, bronchus and lung: Secondary | ICD-10-CM

## 2023-11-17 DIAGNOSIS — Z0000 Encounter for general adult medical examination without abnormal findings: Secondary | ICD-10-CM

## 2023-11-17 DIAGNOSIS — Z87898 Personal history of other specified conditions: Secondary | ICD-10-CM

## 2023-11-17 DIAGNOSIS — I10 Essential (primary) hypertension: Secondary | ICD-10-CM | POA: Diagnosis not present

## 2023-11-17 DIAGNOSIS — J3089 Other allergic rhinitis: Secondary | ICD-10-CM

## 2023-11-17 DIAGNOSIS — I77810 Thoracic aortic ectasia: Secondary | ICD-10-CM

## 2023-11-17 DIAGNOSIS — J301 Allergic rhinitis due to pollen: Secondary | ICD-10-CM | POA: Diagnosis not present

## 2023-11-17 DIAGNOSIS — Z803 Family history of malignant neoplasm of breast: Secondary | ICD-10-CM | POA: Diagnosis not present

## 2023-11-17 DIAGNOSIS — Z2989 Encounter for other specified prophylactic measures: Secondary | ICD-10-CM

## 2023-11-17 LAB — POC URINALSYSI DIPSTICK (AUTOMATED)
Bilirubin, UA: NEGATIVE
Blood, UA: NEGATIVE
Glucose, UA: NEGATIVE
Ketones, UA: NEGATIVE
Leukocytes, UA: NEGATIVE
Nitrite, UA: NEGATIVE
Protein, UA: NEGATIVE
Spec Grav, UA: 1.01 (ref 1.010–1.025)
Urobilinogen, UA: 0.2 U/dL
pH, UA: 6.5 (ref 5.0–8.0)

## 2023-11-17 NOTE — Progress Notes (Signed)
 Annual Wellness Visit   Patient Care Team: Perri Ronal PARAS, MD as PCP - General (Internal Medicine)  Visit Date: 11/17/23   Chief Complaint  Patient presents with   Annual Exam   Subjective:  Patient: Ann Chambers, Female DOB: 10/06/1960, 63 y.o. MRN: [id number] Vitals:   11/17/23 1413 11/17/23 1448  BP: (!) 124/90 134/86   Ann Chambers is a 63 y.o. Female who presents today for her Annual Wellness Visit. Patient has Allergic rhinitis; Migraine headache; Hypertension; Diverticulosis; Family history of lung cancer; Family history of prostate cancer; Genetic testing; Monoallelic mutation of MUTYH gene; Pulmonary nodules/lesions, multiple; Allergic rhinitis due to pollen; and Chronic allergic conjunctivitis on their problem list.  History of Hypertension treated with Valsartan  80 mg daily. Blood Pressure: elevated today at 124/90, 35 minutes later normalized to 134/86.   History of Allergic Rhinitis due to Pollen treated with Allegra-D daily as needed. Allergic Conjunctivitis managed with Optivar eye drops twice daily.   History of Thoracic Aortic Aneurysm initially discovered in 2018 on CT Coronary Calcium Scoring followed by Cardiothoracic Surgery with annual CXRs, next scheduled for 12/11/2023. Since discovery has remained relatively stable, growing slightly from 4 mm to 4.1 mm over the course of several years.   History of Coronary Cardiac Score: 0 in 2023. 11/07/2023 Lipid Panel: WNL.   History of Right Hip Pain treated with Celebrex 200 mg daily and followed by [facility].  Labs 11/07/2023 CBC: WNL CMP: WNL  TSH: 1.58  History of Abnormal PAP, S/p LEEP in 1996; PAP Smear 11/07/2022  normal with repeat recommendation of 2029.  Mammogram 10/06/2023  normal with repeat recommendation of 2026.  Colonoscopy 02/06/2017  visualized multiple Medium-mouthed Diverticula in sigmoid and ascending colon with repeat recommendation of 2028.  Bone Density 08/21/2013 T-score  Right Femoral Neck -0.7, normal, however next year she will be 64 so she could consider repeating this due to guidelines.   Vaccine Counseling: Due for Covid-19 and Shingles 1/2; UTD on Flu and Tdap Health Maintenance Due  Topic Date Due   HIV Screening  Never done   Hepatitis C Screening  Never done   Zoster Vaccines- Shingrix (1 of 2) Never done   COVID-19 Vaccine (4 - 2024-25 season) 01/29/2023   Past Medical History:  Diagnosis Date   Allergic rhinitis    Egg allergy    Essential hypertension    Family history of lung cancer    Family history of prostate cancer    Migraines    Resolved after menopause   Scoliosis   Allergic/Intolerant to:  Allergies  Allergen Reactions   Egg-Derived Products Nausea Only    Raw eggs only   Peanuts [Peanut Oil] Anaphylaxis   Past Surgical History:  Procedure Laterality Date   APPENDECTOMY     right shoulder surgery 2002 and 2005     Family History  Problem Relation Age of Onset   CAD Father        h/o bypass in his 95s, possible heart issues in his 41s   Hypertension Father    Prostate cancer Father        was not metastatic, but his doctors were concerned it could spread   Heart failure Mother        Rheumatic fever, nonischemic   Hypertension Mother    Rheumatic fever Mother    Heart attack Paternal Grandmother    Heart disease Paternal Grandfather    Hypertension Sister    Lung cancer Sister 78  non smoker- had genetic testing and was positve for a gene- thinks it was EGFR?   Heart attack Paternal Aunt    Kidney disease Paternal Uncle    Other Paternal Higinio        'ruptured aorta'  Sister recently diagnosed with Diastolic Heart Failure  Social Hx: Married. No children. Patient works for an [employer]. She is a Water quality scientist. Nonsmoker. Social alcohol consumption.  Review of Systems  Constitutional:  Negative for chills, fever, malaise/fatigue and weight loss.  HENT:  Negative for hearing  loss, sinus pain and sore throat.   Respiratory:  Negative for cough, hemoptysis and shortness of breath.   Cardiovascular:  Negative for chest pain, palpitations, leg swelling and PND.  Gastrointestinal:  Negative for abdominal pain, constipation, diarrhea, heartburn, nausea and vomiting.  Genitourinary:  Negative for dysuria, frequency and urgency.  Musculoskeletal:  Negative for back pain, myalgias and neck pain.  Skin:  Negative for itching and rash.  Neurological:  Negative for dizziness, tingling, seizures and headaches.  Endo/Heme/Allergies:  Negative for polydipsia.  Psychiatric/Behavioral:  Negative for depression. The patient is not nervous/anxious.     Objective:  Vitals: BP 134/86   Pulse 77   Temp 98.6 F (37 C) (Temporal)   Ht 5' 1.25 (1.556 m)   Wt 150 lb 6.4 oz (68.2 kg)   LMP 10/11/2011   SpO2 97%   BMI 28.19 kg/m  Physical Exam Vitals and nursing note reviewed.  Constitutional:      General: She is not in acute distress.    Appearance: Normal appearance. She is not ill-appearing or toxic-appearing.  HENT:     Head: Normocephalic and atraumatic.     Right Ear: Hearing, tympanic membrane, ear canal and external ear normal.     Left Ear: Hearing, tympanic membrane, ear canal and external ear normal.     Mouth/Throat:     Pharynx: Oropharynx is clear.   Eyes:     Extraocular Movements: Extraocular movements intact.     Pupils: Pupils are equal, round, and reactive to light.   Neck:     Thyroid: No thyroid mass, thyromegaly or thyroid tenderness.     Vascular: No carotid bruit.   Cardiovascular:     Rate and Rhythm: Normal rate and regular rhythm. No extrasystoles are present.    Pulses:          Dorsalis pedis pulses are 2+ on the right side and 2+ on the left side.     Heart sounds: Normal heart sounds. No murmur heard.    No friction rub. No gallop.  Pulmonary:     Effort: Pulmonary effort is normal.     Breath sounds: Normal breath sounds. No  decreased breath sounds, wheezing, rhonchi or rales.  Chest:     Chest wall: No mass.  Abdominal:     Palpations: Abdomen is soft. There is no hepatomegaly, splenomegaly or mass.     Tenderness: There is no abdominal tenderness.     Hernia: No hernia is present.   Musculoskeletal:     Cervical back: Normal range of motion.     Right lower leg: No edema.     Left lower leg: No edema.  Lymphadenopathy:     Cervical: No cervical adenopathy.     Upper Body:     Right upper body: No supraclavicular adenopathy.     Left upper body: No supraclavicular adenopathy.   Skin:    General: Skin is warm and  dry.   Neurological:     General: No focal deficit present.     Mental Status: She is alert and oriented to person, place, and time. Mental status is at baseline.     Sensory: Sensation is intact.     Motor: Motor function is intact. No weakness.     Deep Tendon Reflexes: Reflexes are normal and symmetric.   Psychiatric:        Attention and Perception: Attention normal.        Mood and Affect: Mood normal.        Speech: Speech normal.        Behavior: Behavior normal.        Thought Content: Thought content normal.        Cognition and Memory: Cognition normal.        Judgment: Judgment normal.   Most Recent Fall Risk Assessment:    11/17/2023    2:02 PM  Fall Risk   Falls in the past year? 0  Number falls in past yr: 0  Injury with Fall? 0  Risk for fall due to : No Fall Risks  Follow up Falls evaluation completed   Most Recent Depression Screenings:    11/17/2023    2:02 PM 02/25/2022    9:16 AM  PHQ 2/9 Scores  PHQ - 2 Score 0 0   Results:  Studies Obtained And Personally Reviewed By Me:  Narrative & Impression  EXAM: OVER-READ INTERPRETATION  CT CHEST   The following report is an over-read performed by radiologist Dr. Selinda Saas [facility] on 09/01/2016. This over-read does not include interpretation of cardiac or coronary anatomy or pathology.  The coronary calcium score interpretation by the cardiologist is attached.   COMPARISON:  08/05/2005 chest radiograph .   FINDINGS: Cardiovascular: Ectatic ascending thoracic aorta, maximum diameter 4.0 cm. Normal caliber pulmonary arteries.   Mediastinum/Nodes: Unremarkable esophagus. No pathologically enlarged axillary, mediastinal or gross hilar lymph nodes, noting limited sensitivity for the detection of hilar adenopathy on this noncontrast study.   Lungs/Pleura: No pneumothorax. No pleural effusion. There are 2 scattered solid pulmonary nodules, largest 4 mm in the anterior right middle lobe (series 3/image 17). No acute consolidative airspace disease or lung masses.   Upper abdomen: Unremarkable.   Musculoskeletal:  No aggressive appearing focal osseous lesions.   IMPRESSION: 1. Two solid pulmonary nodules, largest 4 mm. No follow-up needed if patient is low-risk (and has no known or suspected primary neoplasm). Non-contrast chest CT can be considered in 12 months if patient is high-risk. This recommendation follows the consensus statement: Guidelines for Management of Incidental Pulmonary Nodules Detected on CT Images: From the Fleischner Society 2017; Radiology 2017; 284:228-243.  2. Ectatic ascending thoracic aorta, maximum diameter 4.0 cm.    CT CHEST WITHOUT CONTRAST 11/20/2019  COMPARISON:  September 01, 2016.   FINDINGS: Cardiovascular: No significant vascular findings. Normal heart size. No pericardial effusion.   Mediastinum/Nodes: No enlarged mediastinal or axillary lymph nodes. Thyroid gland, trachea, and esophagus demonstrate no significant findings.   Lungs/Pleura: No pneumothorax or pleural effusion is noted. 3 mm calcified nodule is seen in lateral portion of left upper lobe best seen on image number 37 of series 8. Mild biapical scarring is noted. Stable 3 mm subpleural nodule is noted medially along the mediastinal pleural surface of the left upper lobe,  best seen on image number 52 of series 8. Stable 4 mm nodule is noted anteriorly in right middle lobe best seen on  image number 83 of series 8. No other nodules or acute inflammation is noted.   Upper Abdomen: No acute abnormality.   Musculoskeletal: No chest wall mass or suspicious bone lesions identified.   IMPRESSION: 1. Stable bilateral pulmonary nodules are noted, with the largest measuring 4 mm in the right middle lobe. These can be considered benign at this point with no further follow-up required.  2. No other abnormality seen in the chest.   CT CHEST WITHOUT CONTRAST 12/17/2020  CLINICAL DATA:  Follow-up thoracic aortic aneurysm and right middle lobe pulmonary nodule.   COMPARISON:  11/20/2019 and coronary calcium score 09/01/2016   FINDINGS: Cardiovascular: Ascending thoracic aorta is ectatic measuring up to 3.9 cm and unchanged since 2018. Heart size is normal. No significant pericardial fluid.   Mediastinum/Nodes: No mediastinal lymph node enlargement. No axillary lymph node enlargement.   Lungs/Pleura: Trachea and mainstem bronchi are patent. No pleural effusions. Peri fissural nodule in the right middle lobe on sequence 8, image 80 measures approximately 3 mm and stable. Focal density in the medial left upper lobe on sequence 8, image 24 and has minimally changed and probably represents a small area of scarring. Stable 3 mm peripheral nodule in the left upper lobe on sequence 8, image 38. No airspace disease or lung consolidation. Tiny nodule along the medial left upper lobe on sequence 8, image 53 is unchanged since 2018. Stable mild scarring along the lateral aspect of the right upper lobe.   Upper Abdomen: No acute abnormality in the upper abdomen.   Musculoskeletal: Severe dextroscoliosis in the thoracolumbar spine.   IMPRESSION: 1. Stable ectasia of the ascending thoracic aorta measuring up to 3.9 cm.  2. Stable small pulmonary nodules. These are  compatible with benign findings based on their size and stability.  3. Scoliosis.   OVER-READ INTERPRETATION  CT CHEST 10/28/2021  COMPARISON:  CT of the chest on 12/17/2020 and additional prior studies.   FINDINGS: Vascular: The ascending thoracic aorta is mildly dilated measuring up to 4 cm in estimated maximum diameter. This is similar to previous diameter by CT of the chest.   Mediastinum/Nodes: Visualized mediastinum and hilar regions demonstrate no lymphadenopathy or masses. Small hiatal hernia present.   Lungs/Pleura: Stable 3 mm fissural nodule in the right middle lobe likely representing an intrapulmonary lymph node. Imaging is not high enough to see the other tiny nodules seen by prior CT. Visualized lungs show no evidence of pulmonary edema, consolidation, pneumothorax or pleural fluid.   Upper Abdomen: No acute abnormality.   Musculoskeletal: No chest wall mass or suspicious bone lesions identified.   IMPRESSION: Mild aneurysmal dilatation of the ascending thoracic aorta measuring up to 4 cm. This is similar to prior measurements of 3.9 cm dating back to a 2018 calcium score study.   CT CHEST WITHOUT CONTRAST  12/15/2022  CLINICAL DATA:  Follow-up of aortic aneurysm    COMPARISON:  Previous studies including cardiac CT done on 10/28/2021   FINDINGS: Cardiovascular: There is ectasia of the ascending thoracic aorta measuring 4.1 cm. Lumen is not evaluated in this noncontrast study. Aortic arch measures 2.8 cm. Descending thoracic aorta measures 2.5 cm in diameter. There are small scattered calcifications in thoracic aorta.   Mediastinum/Nodes: No significant lymphadenopathy is seen.   Lungs/Pleura: There is no focal pulmonary consolidation. There are small scattered pleural plaques in the upper lung fields with no significant change. No discrete lung nodules are seen. There is no pleural effusion or pneumothorax.   Upper Abdomen:  Multiple diverticula as seen  in colon.   Musculoskeletal: No acute findings are seen.   IMPRESSION: There is 4.1 cm aneurysm in ascending thoracic aorta. Recommend annual imaging followup by CTA or MRA.    There is no focal pulmonary consolidation. There is no pleural effusion or pneumothorax.   Diverticulosis of colon.   Coronary Cardiac Score: 0 in 2023.   PAP Smear 11/07/2022  normal.  Mammogram 10/06/2023  normal.  Colonoscopy 02/06/2017  visualized multiple Medium-mouthed Diverticula in sigmoid and ascending colon.  Bone Density 08/21/2013 T-score Right Femoral Neck -0.7, normal.  Labs:     Component Value Date/Time   NA 140 11/07/2023 0910   K 4.4 11/07/2023 0910   CL 106 11/07/2023 0910   CO2 25 11/07/2023 0910   GLUCOSE 87 11/07/2023 0910   BUN 18 11/07/2023 0910   CREATININE 0.64 11/07/2023 0910   CALCIUM 9.4 11/07/2023 0910   PROT 6.6 11/07/2023 0910   ALBUMIN 4.3 06/02/2016 1030   AST 20 11/07/2023 0910   ALT 18 11/07/2023 0910   ALKPHOS 64 06/02/2016 1030   BILITOT 0.5 11/07/2023 0910   GFRNONAA 95 09/24/2020 0909   GFRAA 110 09/24/2020 0909    Lab Results  Component Value Date   WBC 6.5 11/07/2023   HGB 12.7 11/07/2023   HCT 39.7 11/07/2023   MCV 93.4 11/07/2023   PLT 283 11/07/2023   Lab Results  Component Value Date   CHOL 180 11/07/2023   HDL 93 11/07/2023   LDLCALC 74 11/07/2023   TRIG 47 11/07/2023   CHOLHDL 1.9 11/07/2023   Lab Results  Component Value Date   TSH 1.58 11/07/2023      Component Value Date/Time   BILIRUBINUR n 11/17/2023 1449   PROTEINUR Negative 11/17/2023 1449   UROBILINOGEN 0.2 11/17/2023 1449   NITRITE n 11/17/2023 1449   LEUKOCYTESUR Negative 11/17/2023 1449   Assessment & Plan:   Orders Placed This Encounter  Procedures   POCT Urinalysis Dipstick (Automated)  Other Labs Reviewed today: CBC: WNL CMP: WNL  TSH: 1.58  Hypertension treated with Valsartan  80 mg daily. Blood Pressure: elevated today at 124/90, 35 minutes later normalized  to 134/86.   Allergic Rhinitis due to Pollen treated with Allegra-D daily as needed.  Allergic Conjunctivitis managed with Optivar eye drops twice daily.   Thoracic Aortic Aneurysm initially discovered in 2018 on CT Coronary Calcium Scoring followed by Cardiothoracic Surgery with annual CXRs, next scheduled for 12/11/2023. Since discovery has remained relatively stable, growing slightly from 4 mm to 4.1 mm over the course of several years.  History of Coronary Cardiac Score: 0 in 2023. 11/07/2023 Lipid Panel: WNL.   Right Hip Pain treated with Celebrex 200 mg daily and followed by [facility].  History of Abnormal PAP, S/p LEEP in 1996.  PAP Smear 11/07/2022  normal with repeat recommendation of 2029.  Mammogram 10/06/2023  normal with repeat recommendation of 2026.  Colonoscopy 02/06/2017  visualized multiple Medium-mouthed Diverticula in sigmoid and ascending colon with repeat recommendation of 2028.  Bone Density 08/21/2013 T-score Right Femoral Neck -0.7, normal, however next year she will be 64 so she could consider repeating this due to guidelines.   Vaccine Counseling: Due for Covid-19 and Shingles 1/2; UTD on Flu and Tdap   Annual wellness visit done today including the all of the following: Reviewed patient's Family Medical History Reviewed and updated list of patient's medical providers Assessment of cognitive impairment was done Assessed patient's functional ability Established a written schedule for  health screening services Health Risk Assessent Completed and Reviewed  Discussed health benefits of physical activity, and encouraged her to engage in regular exercise appropriate for her age and condition.    I,Emily Lagle,acting as a Neurosurgeon for Ronal JINNY Hailstone, MD.,have documented all relevant documentation on the behalf of Ronal JINNY Hailstone, MD,as directed by  Ronal JINNY Hailstone, MD while in the presence of Ronal JINNY Hailstone, MD.   I, Ronal JINNY Hailstone, MD, have reviewed all documentation for  this visit. The documentation on 11/26/23 for the exam, diagnosis, procedures, and orders are all accurate and complete.

## 2023-11-24 ENCOUNTER — Encounter: Admitting: Internal Medicine

## 2023-11-24 DIAGNOSIS — J301 Allergic rhinitis due to pollen: Secondary | ICD-10-CM | POA: Diagnosis not present

## 2023-11-24 DIAGNOSIS — J3081 Allergic rhinitis due to animal (cat) (dog) hair and dander: Secondary | ICD-10-CM | POA: Diagnosis not present

## 2023-11-24 DIAGNOSIS — J3089 Other allergic rhinitis: Secondary | ICD-10-CM | POA: Diagnosis not present

## 2023-11-26 ENCOUNTER — Encounter: Payer: Self-pay | Admitting: Internal Medicine

## 2023-11-26 NOTE — Patient Instructions (Addendum)
 Continue to monitor your blood pressure at home and advise if it is not consistently around 120/80. Continue follow up with Cardiothoracic Surgery regarding aneurysmal dilatation of aorta. Consider Shingrix vaccines. Return in one year or as needed. It was a pleasure to see you today.

## 2023-12-04 ENCOUNTER — Encounter: Admitting: Internal Medicine

## 2023-12-11 ENCOUNTER — Ambulatory Visit (HOSPITAL_COMMUNITY)
Admission: RE | Admit: 2023-12-11 | Discharge: 2023-12-11 | Disposition: A | Discharge status: Home / Self Care | Source: Ambulatory Visit | Attending: Thoracic Surgery (Cardiothoracic Vascular Surgery) | Admitting: Thoracic Surgery (Cardiothoracic Vascular Surgery)

## 2023-12-11 DIAGNOSIS — R918 Other nonspecific abnormal finding of lung field: Secondary | ICD-10-CM | POA: Diagnosis not present

## 2023-12-11 DIAGNOSIS — I7121 Aneurysm of the ascending aorta, without rupture: Secondary | ICD-10-CM | POA: Insufficient documentation

## 2023-12-11 DIAGNOSIS — J3081 Allergic rhinitis due to animal (cat) (dog) hair and dander: Secondary | ICD-10-CM | POA: Diagnosis not present

## 2023-12-11 DIAGNOSIS — I700 Atherosclerosis of aorta: Secondary | ICD-10-CM | POA: Diagnosis not present

## 2023-12-11 DIAGNOSIS — I77810 Thoracic aortic ectasia: Secondary | ICD-10-CM | POA: Diagnosis not present

## 2023-12-11 DIAGNOSIS — J3089 Other allergic rhinitis: Secondary | ICD-10-CM | POA: Diagnosis not present

## 2023-12-11 DIAGNOSIS — J301 Allergic rhinitis due to pollen: Secondary | ICD-10-CM | POA: Diagnosis not present

## 2023-12-14 NOTE — Progress Notes (Deleted)
 [address]               Thurmon BROCKS [place], [place] [address]                      [phone]   PCP is Baxley, Ronal PARAS, MD Referring Provider is Perri Ronal PARAS, MD  Chief Complaint: Ectasia of the ascending thoracic aorta   HPI: This is a 63 year old female with a past medical history of essential hypertension, scoliosis, and migraines Who was incidentally found to have an ectasia of the ascending aorta measuring 3.9 cm which has been present since 2018. She was also noted to have multiple pulmonary nodules (benign). She was last seen by my colleague in July 2024 and the ectasia of the ascending thoracic aorta measured 4.1 cm at that time. She presents today for further surveillance of ectasia of the ascending thoracic aorta. She denies chest pain, pressure, or tightness.  Past Medical History:  Diagnosis Date   Allergic rhinitis    Egg allergy    Essential hypertension    Family history of lung cancer    Family history of prostate cancer    Migraines    Resolved after menopause   Scoliosis     Past Surgical History:  Procedure Laterality Date   APPENDECTOMY     right shoulder surgery 2002 and 2005      Family History  Problem Relation Age of Onset   CAD Father        h/o bypass in his 34s, possible heart issues in his 68s   Hypertension Father    Prostate cancer Father        was not metastatic, but his doctors were concerned it could spread   Heart failure Mother        Rheumatic fever, nonischemic   Hypertension Mother    Rheumatic fever Mother    Heart attack Paternal Grandmother    Heart disease Paternal Grandfather    Hypertension Sister    Lung cancer Sister 66       non smoker- had genetic testing and was positve for a gene- thinks it was EGFR?   Heart attack Paternal Aunt    Kidney disease Paternal Uncle    Other Paternal Higinio        'ruptured aorta'    Social History Social History   Tobacco Use   Smoking status: Never    Smokeless tobacco: Never  Substance Use Topics   Alcohol use: Yes    Alcohol/week: 3.0 standard drinks of alcohol    Types: 3 Glasses of wine per week    Comment: Glass of wine 3-5 nights a week   Drug use: No    Current Outpatient Medications  Medication Sig Dispense Refill   azelastine (OPTIVAR) 0.05 % ophthalmic solution Place 1 drop into both eyes 2 (two) times daily.     celecoxib (CELEBREX) 200 MG capsule Take 200 mg by mouth daily.     co-enzyme Q-10 30 MG capsule Take 100 mg by mouth daily.     ELDERBERRY PO Take 1 capsule by mouth daily. 400 mg Black Elderberry with Vitamin C 100 mg and Zinc 7.5mg      EPIPEN 2-PAK 0.3 MG/0.3ML DEVI      Misc Natural Products (BEET ROOT PO) Take 1 capsule by mouth daily.     Multiple Vitamins-Minerals (PRESERVISION AREDS 2) CAPS  Take 2 capsules by mouth daily.     Omega 3 1200 MG CAPS Take by mouth.     Turmeric (QC TUMERIC COMPLEX PO) Take 1 capsule by mouth daily.     valsartan  (DIOVAN ) 80 MG tablet TAKE 1 TABLET BY MOUTH EVERY DAY 90 tablet 1     Allergies  Allergen Reactions   Egg-Derived Products Nausea Only    Raw eggs only   Peanuts [Peanut Oil] Anaphylaxis   Vital Signs:  Physical Exam: CV- Neck- Pulmonary- Abdomen- Extremities- Neurologic-  Diagnostic Tests:  Narrative & Impression  CLINICAL DATA:  Follow-up mildly dilated ascending aorta, subsequent encounter   EXAM: CT CHEST WITHOUT CONTRAST   TECHNIQUE: Multidetector CT imaging of the chest was performed following the standard protocol without IV contrast.   RADIATION DOSE REDUCTION: This exam was performed according to the departmental dose-optimization program which includes automated exposure control, adjustment of the mA and/or kV according to patient size and/or use of iterative reconstruction technique.   COMPARISON:  12/15/2022   FINDINGS: Cardiovascular: Somewhat limited due to lack of IV contrast. Atherosclerotic calcifications are noted.  Dilatation of the ascending aorta to 4.1 cm is again noted. This is stable in appearance from the prior exam. Pulmonary artery is not significantly enlarged. No coronary calcifications are noted.   Mediastinum/Nodes: Thoracic inlet is within normal limits. No hilar or mediastinal adenopathy is noted. The esophagus as visualized is within normal limits.   Lungs/Pleura: Lungs are well aerated bilaterally. A few scattered small less than 5 mm nodules are noted bilaterally. No follow-up is recommended. No effusion focal infiltrate is noted.   Upper Abdomen: Diverticular change of the colon is noted. No diverticulitis is seen. No other focal abnormality is noted.   Musculoskeletal: Degenerative changes of the thoracic spine are noted. Scoliosis in the lumbar spine is seen.   IMPRESSION: Dilatation of the ascending aorta to 4.1 cm stable from the prior exam. Recommend annual imaging followup by CTA or MRA. This recommendation follows 2010 ACCF/AHA/AATS/ACR/ASA/SCA/SCAI/SIR/STS/SVM Guidelines for the Diagnosis and Management of Patients with Thoracic Aortic Disease. Circulation. 2010; 121: Z733-z630. Aortic aneurysm NOS (ICD10-I71.9)   Multiple pulmonary nodules. Most significant: Solid pulmonary nodule measuring 4 mm. Per Fleischner Society Guidelines, no routine follow-up imaging is recommended. These guidelines do not apply to immunocompromised patients and patients with cancer. Follow up in patients with significant comorbidities as clinically warranted. For lung cancer screening, adhere to Lung-RADS guidelines. Reference: Radiology. 2017; 284(1):228-43.   Aortic Atherosclerosis (ICD10-I70.0).     Electronically Signed   By: Oneil Devonshire M.D.   On: 12/11/2023 23:13   Risk Modification in those with ascending thoracic aortic aneurysm:  Continue good control of blood pressure (prefer SBP 130/80 or less)-continue Valsartan  (Diovan )  2. Avoid fluoroquinolone antibiotics (I.e  Ciprofloxacin , Avelox, Levofloxacin, Ofloxacin )  3.  Use of statin (to decrease cardiovascular risk)-Lipid Profile done June 2025: Total cholesterol 180, HDL 93, Triglycerides 47, and LDL 74. Will defer to PCP if/when to initiate  4.  Exercise and activity limitations is individualized, but in general, contact sports are to be  avoided and one should avoid heavy lifting (defined as half of ideal body weight) and exercises involving sustained Valsalva maneuver.  5. Counseling for those suspected of having genetically mediated disease. First-degree relatives of those with TAA disease should be screened as well as those who have a connective tissue disease (I.e with Marfan syndrome, Ehlers-Danlos syndrome,  and Loeys-Dietz syndrome) or a  bicuspid aortic valve,have an increased risk  for  complications related to TAA. She denies family history of TAA and personal history of connective tissue disorder.   Echocardiogram done July 2021 showed LVEF 55-60%, aortic valve is tricuspid. Aortic valve regurgitation is trivial. No aortic stenosis is present.   6. She has no history of tobacco abuse   Impression and Plan: CT of the chest  with a 4.1 cm ascending aortic aneurysm.  Echocardiogram shows a tri cuspid aortic valve with evidence of trivial aortic regurgitation.  We discussed the natural history and and risk factors for growth of ascending aortic aneurysms.  We covered the importance of smoking cessation, tight blood pressure control, refraining from lifting heavy objects, and avoiding fluoroquinolones.  The patient is aware of signs and symptoms of aortic dissection and when to present to the emergency department.  We will continue surveillance and a repeat CT of the chest without contrast was ordered for 1 year.   Kyla CHRISTELLA Donald, PA-C [facility] [phone]

## 2023-12-19 ENCOUNTER — Ambulatory Visit

## 2023-12-20 DIAGNOSIS — J301 Allergic rhinitis due to pollen: Secondary | ICD-10-CM | POA: Diagnosis not present

## 2023-12-20 DIAGNOSIS — J3089 Other allergic rhinitis: Secondary | ICD-10-CM | POA: Diagnosis not present

## 2023-12-21 NOTE — Progress Notes (Unsigned)
 [address] [place] [address]             [phone]            Ann Chambers [id number] 01-17-1961   History of Present Illness: [name] is a 63 year old female with medical history of hypertension, migraine, and diverticulosis presents for continued surveillance of ascending aortic aneurysm.  The aneurysm was found in 2018 and has been stable in size at 4.1 cm.  Multiple pulmonary nodules were present on CT scan which require no follow up at this time.   She has good control of her blood pressure.  She reports home readings in the 120/70s.  She checks her blood pressure regularly and takes her medications as prescribed.  She does exercise consistently with piliates, weight training and walking.  She denies symptoms of chest pain, shortness of breath and lower leg swelling.      Current Outpatient Medications on File Prior to Visit  Medication Sig Dispense Refill   azelastine (OPTIVAR) 0.05 % ophthalmic solution Place 1 drop into both eyes 2 (two) times daily.     celecoxib (CELEBREX) 200 MG capsule Take 200 mg by mouth daily.     co-enzyme Q-10 30 MG capsule Take 100 mg by mouth daily.     ELDERBERRY PO Take 1 capsule by mouth daily. 400 mg Black Elderberry with Vitamin C 100 mg and Zinc 7.5mg      EPIPEN 2-PAK 0.3 MG/0.3ML DEVI      Misc Natural Products (BEET ROOT PO) Take 1 capsule by mouth daily.     Multiple Vitamins-Minerals (PRESERVISION AREDS 2) CAPS Take 2 capsules by mouth daily.     Omega 3 1200 MG CAPS Take by mouth.     Turmeric (QC TUMERIC COMPLEX PO) Take 1 capsule by mouth daily.     valsartan  (DIOVAN ) 80 MG tablet TAKE 1 TABLET BY MOUTH EVERY DAY 90 tablet 1   No current facility-administered medications on file prior to visit.     ROS: Review of Systems  Respiratory: Negative.  Negative for cough and shortness of breath.   Cardiovascular: Negative.  Negative for chest pain and leg swelling.   Neurological: Negative.  Negative for headaches.     BP 129/85 (BP Location: Left Arm, Patient Position: Sitting, Cuff Size: Normal)   Pulse 66   Resp 20   Wt 154 lb 6.4 oz (70 kg)   LMP 10/11/2011   SpO2 99% Comment: RA  BMI 28.94 kg/m   Physical Exam Constitutional:      Appearance: Normal appearance.  HENT:     Head: Normocephalic and atraumatic.  Cardiovascular:     Rate and Rhythm: Normal rate and regular rhythm.     Heart sounds: Normal heart sounds, S1 normal and S2 normal.  Pulmonary:     Effort: Pulmonary effort is normal.     Breath sounds: Normal breath sounds.  Musculoskeletal:     Cervical back: Normal range of motion.  Skin:    General: Skin is warm and dry.  Neurological:     General: No focal deficit present.     Mental Status: She is alert.      Imaging: CLINICAL DATA:  Follow-up mildly dilated ascending aorta, subsequent encounter   EXAM: CT CHEST WITHOUT CONTRAST   TECHNIQUE: Multidetector CT imaging of the chest was performed following the standard protocol without IV contrast.   RADIATION  DOSE REDUCTION: This exam was performed according to the departmental dose-optimization program which includes automated exposure control, adjustment of the mA and/or kV according to patient size and/or use of iterative reconstruction technique.   COMPARISON:  12/15/2022   FINDINGS: Cardiovascular: Somewhat limited due to lack of IV contrast. Atherosclerotic calcifications are noted. Dilatation of the ascending aorta to 4.1 cm is again noted. This is stable in appearance from the prior exam. Pulmonary artery is not significantly enlarged. No coronary calcifications are noted.   Mediastinum/Nodes: Thoracic inlet is within normal limits. No hilar or mediastinal adenopathy is noted. The esophagus as visualized is within normal limits.   Lungs/Pleura: Lungs are well aerated bilaterally. A few scattered small less than 5 mm nodules are noted  bilaterally. No follow-up is recommended. No effusion focal infiltrate is noted.   Upper Abdomen: Diverticular change of the colon is noted. No diverticulitis is seen. No other focal abnormality is noted.   Musculoskeletal: Degenerative changes of the thoracic spine are noted. Scoliosis in the lumbar spine is seen.   IMPRESSION: Dilatation of the ascending aorta to 4.1 cm stable from the prior exam. Recommend annual imaging followup by CTA or MRA. This recommendation follows 2010 ACCF/AHA/AATS/ACR/ASA/SCA/SCAI/SIR/STS/SVM Guidelines for the Diagnosis and Management of Patients with Thoracic Aortic Disease. Circulation. 2010; 121: Z733-z630. Aortic aneurysm NOS (ICD10-I71.9)   Multiple pulmonary nodules. Most significant: Solid pulmonary nodule measuring 4 mm. Per Fleischner Society Guidelines, no routine follow-up imaging is recommended. These guidelines do not apply to immunocompromised patients and patients with cancer. Follow up in patients with significant comorbidities as clinically warranted. For lung cancer screening, adhere to Lung-RADS guidelines. Reference: Radiology. 2017; 284(1):228-43.   Aortic Atherosclerosis (ICD10-I70.0).     Electronically Signed   By: Oneil Devonshire M.D.   On: 12/11/2023 23:13    A/P:  Ascending aortic aneurysm, unspecified whether ruptured (HCC) -4.1 cm ascending thoracic aortic aneurysm on CTA of chest.  This has remained stable in size over the past year. Pulmonary nodules have remained small and no further imaging or follow up is needed at this time.   We discussed the natural history and and risk factors for growth of ascending aortic aneurysms. Discussed recommendations to minimize the risk of further expansion or dissection including careful blood pressure control, avoidance of contact sports and heavy lifting, attention to lipid management.  We covered the importance of continuing to stay a never smoker.  The patient does not yet meet  surgical criteria of >5.5cm. The patient is aware of signs and symptoms of aortic dissection and when to present to the emergency department    -Follow up in one year with CTA of chest  Risk Modification:  Statin:  not prescribed at this time  Smoking cessation instruction/counseling given:  never smoker  Patient was counseled on importance of Blood Pressure Control. They are instructed to contact their Primary Care Physician if they start to have blood pressure readings over 130s/90s. Do not ever stop blood pressure medications on your own, unless instructed by healthcare professional.  Please avoid use of Fluoroquinolones as this can potentially increase your risk of Aortic Rupture and/or Dissection  Patient educated on signs and symptoms of Aortic Dissection, handout also provided in AVS  Manuelita CHRISTELLA Rough, PA-C 12/22/23

## 2023-12-22 ENCOUNTER — Ambulatory Visit

## 2023-12-22 VITALS — BP 129/85 | HR 66 | Resp 20 | Wt 154.4 lb

## 2023-12-22 DIAGNOSIS — I7121 Aneurysm of the ascending aorta, without rupture: Secondary | ICD-10-CM

## 2023-12-22 DIAGNOSIS — I712 Thoracic aortic aneurysm, without rupture, unspecified: Secondary | ICD-10-CM | POA: Insufficient documentation

## 2023-12-22 NOTE — Patient Instructions (Signed)
 Risk Modification in those with ascending thoracic aortic aneurysm:   Continue control of blood pressure (prefer BP 130/80 or less)   2. Avoid fluoroquinolone antibiotics (I.e Ciprofloxacin , Avelox, Levofloxacin, Ofloxacin )   3.  Use of statin (to decrease cardiovascular risk)   4.  Exercise and activity limitations is individualized, but in general, contact sports are to be avoided and one should avoid heavy lifting (defined as half of ideal body weight) and exercises involving sustained Valsalva maneuver.   5.  Follow-up in one with CT chest.  OK to use a non-contrast CT if you have had a recent study for surveillance of the lung nodule.

## 2024-01-04 DIAGNOSIS — J301 Allergic rhinitis due to pollen: Secondary | ICD-10-CM | POA: Diagnosis not present

## 2024-01-05 DIAGNOSIS — J3089 Other allergic rhinitis: Secondary | ICD-10-CM | POA: Diagnosis not present

## 2024-01-16 DIAGNOSIS — J3081 Allergic rhinitis due to animal (cat) (dog) hair and dander: Secondary | ICD-10-CM | POA: Diagnosis not present

## 2024-01-16 DIAGNOSIS — J3089 Other allergic rhinitis: Secondary | ICD-10-CM | POA: Diagnosis not present

## 2024-01-16 DIAGNOSIS — J301 Allergic rhinitis due to pollen: Secondary | ICD-10-CM | POA: Diagnosis not present

## 2024-01-22 DIAGNOSIS — J3081 Allergic rhinitis due to animal (cat) (dog) hair and dander: Secondary | ICD-10-CM | POA: Diagnosis not present

## 2024-01-22 DIAGNOSIS — J301 Allergic rhinitis due to pollen: Secondary | ICD-10-CM | POA: Diagnosis not present

## 2024-01-22 DIAGNOSIS — J3089 Other allergic rhinitis: Secondary | ICD-10-CM | POA: Diagnosis not present

## 2024-01-30 DIAGNOSIS — J3089 Other allergic rhinitis: Secondary | ICD-10-CM | POA: Diagnosis not present

## 2024-01-30 DIAGNOSIS — J301 Allergic rhinitis due to pollen: Secondary | ICD-10-CM | POA: Diagnosis not present

## 2024-02-05 ENCOUNTER — Ambulatory Visit (INDEPENDENT_AMBULATORY_CARE_PROVIDER_SITE_OTHER): Admitting: Internal Medicine

## 2024-02-05 ENCOUNTER — Encounter: Payer: Self-pay | Admitting: Internal Medicine

## 2024-02-05 VITALS — BP 150/100 | HR 68 | Ht 61.25 in | Wt 153.0 lb

## 2024-02-05 DIAGNOSIS — I77810 Thoracic aortic ectasia: Secondary | ICD-10-CM | POA: Diagnosis not present

## 2024-02-05 DIAGNOSIS — H6591 Unspecified nonsuppurative otitis media, right ear: Secondary | ICD-10-CM | POA: Diagnosis not present

## 2024-02-05 DIAGNOSIS — J301 Allergic rhinitis due to pollen: Secondary | ICD-10-CM

## 2024-02-05 DIAGNOSIS — I10 Essential (primary) hypertension: Secondary | ICD-10-CM

## 2024-02-05 DIAGNOSIS — H8111 Benign paroxysmal vertigo, right ear: Secondary | ICD-10-CM | POA: Diagnosis not present

## 2024-02-05 DIAGNOSIS — Z9109 Other allergy status, other than to drugs and biological substances: Secondary | ICD-10-CM

## 2024-02-05 MED ORDER — ALPRAZOLAM 0.25 MG PO TABS: ORAL_TABLET | ORAL | 0 refills | Status: AC

## 2024-02-05 MED ORDER — METHYLPREDNISOLONE ACETATE 80 MG/ML IJ SUSP
80.0000 mg | Freq: Once | INTRAMUSCULAR | Status: AC
  Administered 2024-02-05: 80 mg via INTRAMUSCULAR

## 2024-02-05 MED ORDER — AZITHROMYCIN 250 MG PO TABS: ORAL_TABLET | ORAL | 0 refills | Status: AC

## 2024-02-05 NOTE — Progress Notes (Signed)
 Patient Care Team: Perri Ronal PARAS, MD as PCP - General (Internal Medicine)  Visit Date: 02/05/24  Subjective:    Patient ID: Ann Chambers , Female   DOB: 1960-06-25, 63 y.o.    MRN: [id number]   63 y.o. Female presents today with left ear congestion ear congestion and  vertigo. Patient has a past medical history of Migraine headaches, Hypertension, ascending aortic aneurysm followed by CV surgery, Allergic rhinitis . Seen at [facility] for immunotherapy for allergic rhinitis.   She says she has  congested feeling in her right ear and when she turns her head to the right she  experiences dizziness.She says her left ear is fine. When she experiences vertigo , she normally does an Epley maneuver to alleviate it. She took some Mucinex with decongestant to relieve ear congestion.  History of  Essential hypertension treated with Valsartan  80 mg daily. Blood pressure today was elevated at 150/100.    Past Medical History:  Diagnosis Date   Allergic rhinitis    Egg allergy    Essential hypertension    Family history of lung cancer    Family history of prostate cancer    Migraines    Resolved after menopause   Scoliosis      Family History  Problem Relation Age of Onset   CAD Father        h/o bypass in his 22s, possible heart issues in his 71s   Hypertension Father    Prostate cancer Father        was not metastatic, but his doctors were concerned it could spread   Heart failure Mother        Rheumatic fever, nonischemic   Hypertension Mother    Rheumatic fever Mother    Heart attack Paternal Grandmother    Heart disease Paternal Grandfather    Hypertension Sister    Lung cancer Sister 40       non smoker- had genetic testing and was positve for a gene- thinks it was EGFR?   Heart attack Paternal Aunt    Kidney disease Paternal Uncle    Other Paternal Higinio        'ruptured aorta'    Review of Systems  HENT:  Positive for congestion (in her right ear).    Neurological:  Positive for dizziness.        Objective:   Vitals: BP (!) 150/100   Pulse 68   Ht 5' 1.25 (1.556 m)   Wt 153 lb (69.4 kg)   LMP 10/11/2011   SpO2 96%   BMI 28.67 kg/m      Right TM full but not red.Left TM clear. Pharynx is clear. Neck supple. No cervical adenopathy. Chest clear. Cor: RRR  Results:      Labs:       Component Value Date/Time   NA 140 11/07/2023 0910   K 4.4 11/07/2023 0910   CL 106 11/07/2023 0910   CO2 25 11/07/2023 0910   GLUCOSE 87 11/07/2023 0910   BUN 18 11/07/2023 0910   CREATININE 0.64 11/07/2023 0910   CALCIUM 9.4 11/07/2023 0910   PROT 6.6 11/07/2023 0910   ALBUMIN 4.3 06/02/2016 1030   AST 20 11/07/2023 0910   ALT 18 11/07/2023 0910   ALKPHOS 64 06/02/2016 1030   BILITOT 0.5 11/07/2023 0910   GFRNONAA 95 09/24/2020 0909   GFRAA 110 09/24/2020 0909     Lab Results  Component Value Date   WBC 6.5 11/07/2023  HGB 12.7 11/07/2023   HCT 39.7 11/07/2023   MCV 93.4 11/07/2023   PLT 283 11/07/2023    Lab Results  Component Value Date   CHOL 180 11/07/2023   HDL 93 11/07/2023   LDLCALC 74 11/07/2023   TRIG 47 11/07/2023   CHOLHDL 1.9 11/07/2023    No results found for: HGBA1C   Lab Results  Component Value Date   TSH 1.58 11/07/2023     Assessment & Plan:   Benign positional vertigo- take Xanax  0.25 mg twice daily for 5-7 days   Right serous otitis media: She said she was having congestion in her right ear and when she turns her head to the right she gets dizzy. She says her left ear is fine. When she gets vertigo she normally does the Epley maneuver to alleviate it. She also took  Mucinex She received  Depo-medrol  injection 80 mg IM in office  She was prescribed Xanax  0.25 mg one tab twice a day as needed for vertigo. May take 5-7 days to resolve She was prescribed Zithromax  250 mg tablet 2 tablets on day one and 1 tablet on days 2 through five for serous otitis media   Hypertension: treated with  valsartan  80 mg daily. Blood pressure today was elevated at 150/100 but pt not feeling well with vertigo  Allergic rhinitis- receives immunotherapy per [facility]  Dilatation of thoracic aorta 4.1 cm followed by CVTS with regular imaging studies  I,Makayla C Reid,acting as a scribe for Ronal JINNY Hailstone, MD.,have documented all relevant documentation on the behalf of Ronal JINNY Hailstone, MD,as directed by  Ronal JINNY Hailstone, MD while in the presence of Ronal JINNY Hailstone, MD.  I, Ronal JINNY Hailstone, MD, have reviewed all documentation for this visit. The documentation on 02/05/2024 for the exam, diagnosis, procedures, and orders are all accurate and complete.

## 2024-02-11 NOTE — Patient Instructions (Signed)
 You have benign positional vertigo and serous otitis media of right ear. You have received Depomedrol 80 mg IM in the office. Take Xanax  twice daily for 5-7 days for vertigo which can be recurrent from time to time. Continue valsartan  for hypertension. Continue your allergy injections per Dr. Frutoso.Rest and stay well hydrated. Contact us  if not better in 5-7 days or sooner if worse.

## 2024-02-15 ENCOUNTER — Other Ambulatory Visit: Payer: Self-pay | Admitting: Internal Medicine

## 2024-06-27 NOTE — Progress Notes (Signed)
 Ann Chambers                                          MRN: [id number]   06/27/2024   The [facility] Quality Team Specialist reviewed this patient medical record for the purposes of chart review for care gap closure. The following were reviewed: chart review for care gap closure-controlling blood pressure.    [facility] Quality Team

## 2024-11-18 ENCOUNTER — Other Ambulatory Visit: Payer: Self-pay

## 2024-11-22 ENCOUNTER — Encounter: Payer: Self-pay | Admitting: Internal Medicine
